# Patient Record
Sex: Female | Born: 1935 | Race: White | Hispanic: No | Marital: Single | Smoking: Never smoker
Health system: Southern US, Community
[De-identification: ages and names within clinical notes are randomized; demographics above are authoritative.]

## PROBLEM LIST (undated history)

## (undated) DIAGNOSIS — G4700 Insomnia, unspecified: Secondary | ICD-10-CM

## (undated) DIAGNOSIS — F0150 Vascular dementia without behavioral disturbance: Secondary | ICD-10-CM

## (undated) DIAGNOSIS — M810 Age-related osteoporosis without current pathological fracture: Secondary | ICD-10-CM

## (undated) DIAGNOSIS — I10 Essential (primary) hypertension: Secondary | ICD-10-CM

## (undated) DIAGNOSIS — I639 Cerebral infarction, unspecified: Secondary | ICD-10-CM

## (undated) DIAGNOSIS — H905 Unspecified sensorineural hearing loss: Secondary | ICD-10-CM

## (undated) DIAGNOSIS — I6529 Occlusion and stenosis of unspecified carotid artery: Secondary | ICD-10-CM

## (undated) DIAGNOSIS — H409 Unspecified glaucoma: Secondary | ICD-10-CM

## (undated) DIAGNOSIS — G2581 Restless legs syndrome: Secondary | ICD-10-CM

## (undated) DIAGNOSIS — H269 Unspecified cataract: Secondary | ICD-10-CM

## (undated) DIAGNOSIS — F32A Depression, unspecified: Secondary | ICD-10-CM

## (undated) DIAGNOSIS — F419 Anxiety disorder, unspecified: Secondary | ICD-10-CM

---

## 2021-09-08 ENCOUNTER — Other Ambulatory Visit: Payer: Self-pay

## 2021-09-08 ENCOUNTER — Encounter: Payer: Self-pay | Admitting: Emergency Medicine

## 2021-09-08 ENCOUNTER — Emergency Department: Payer: Medicare PPO

## 2021-09-08 ENCOUNTER — Emergency Department
Admission: EM | Admit: 2021-09-08 | Discharge: 2021-09-08 | Disposition: A | Discharge status: Home / Self Care | Payer: Medicare PPO | Attending: Emergency Medicine | Admitting: Emergency Medicine

## 2021-09-08 DIAGNOSIS — I10 Essential (primary) hypertension: Secondary | ICD-10-CM | POA: Diagnosis not present

## 2021-09-08 DIAGNOSIS — F0390 Unspecified dementia without behavioral disturbance: Secondary | ICD-10-CM | POA: Diagnosis not present

## 2021-09-08 DIAGNOSIS — M545 Low back pain, unspecified: Secondary | ICD-10-CM | POA: Insufficient documentation

## 2021-09-08 DIAGNOSIS — G8929 Other chronic pain: Secondary | ICD-10-CM | POA: Diagnosis not present

## 2021-09-08 HISTORY — DX: Depression, unspecified: F32.A

## 2021-09-08 HISTORY — DX: Unspecified glaucoma: H40.9

## 2021-09-08 HISTORY — DX: Vascular dementia, unspecified severity, without behavioral disturbance, psychotic disturbance, mood disturbance, and anxiety: F01.50

## 2021-09-08 HISTORY — DX: Cerebral infarction, unspecified: I63.9

## 2021-09-08 HISTORY — DX: Insomnia, unspecified: G47.00

## 2021-09-08 HISTORY — DX: Essential (primary) hypertension: I10

## 2021-09-08 HISTORY — DX: Anxiety disorder, unspecified: F41.9

## 2021-09-08 LAB — URINALYSIS, ROUTINE W REFLEX MICROSCOPIC
Bacteria, UA: NONE SEEN
Bilirubin Urine: NEGATIVE
Glucose, UA: NEGATIVE mg/dL
Hgb urine dipstick: NEGATIVE
Ketones, ur: NEGATIVE mg/dL
Leukocytes,Ua: NEGATIVE
Nitrite: NEGATIVE
Protein, ur: 30 mg/dL — AB
Specific Gravity, Urine: 1.019 (ref 1.005–1.030)
pH: 6 (ref 5.0–8.0)

## 2021-09-08 LAB — CBC WITH DIFFERENTIAL/PLATELET
Abs Immature Granulocytes: 0.01 10*3/uL (ref 0.00–0.07)
Basophils Absolute: 0 10*3/uL (ref 0.0–0.1)
Basophils Relative: 1 %
Eosinophils Absolute: 0.1 10*3/uL (ref 0.0–0.5)
Eosinophils Relative: 1 %
HCT: 41.5 % (ref 36.0–46.0)
Hemoglobin: 13.3 g/dL (ref 12.0–15.0)
Immature Granulocytes: 0 %
Lymphocytes Relative: 37 %
Lymphs Abs: 2.8 10*3/uL (ref 0.7–4.0)
MCH: 28.2 pg (ref 26.0–34.0)
MCHC: 32 g/dL (ref 30.0–36.0)
MCV: 87.9 fL (ref 80.0–100.0)
Monocytes Absolute: 0.7 10*3/uL (ref 0.1–1.0)
Monocytes Relative: 10 %
Neutro Abs: 3.9 10*3/uL (ref 1.7–7.7)
Neutrophils Relative %: 51 %
Platelets: 323 10*3/uL (ref 150–400)
RBC: 4.72 MIL/uL (ref 3.87–5.11)
RDW: 13.3 % (ref 11.5–15.5)
WBC: 7.5 10*3/uL (ref 4.0–10.5)
nRBC: 0 % (ref 0.0–0.2)

## 2021-09-08 LAB — BASIC METABOLIC PANEL
Anion gap: 7 (ref 5–15)
BUN: 26 mg/dL — ABNORMAL HIGH (ref 8–23)
CO2: 25 mmol/L (ref 22–32)
Calcium: 9.5 mg/dL (ref 8.9–10.3)
Chloride: 109 mmol/L (ref 98–111)
Creatinine, Ser: 0.88 mg/dL (ref 0.44–1.00)
GFR, Estimated: >60 mL/min (ref >60)
Glucose, Bld: 113 mg/dL — ABNORMAL HIGH (ref 70–99)
Potassium: 4 mmol/L (ref 3.5–5.1)
Sodium: 141 mmol/L (ref 135–145)

## 2021-09-08 LAB — BRAIN NATRIURETIC PEPTIDE: B Natriuretic Peptide: 84.3 pg/mL (ref 0.0–100.0)

## 2021-09-08 MED ORDER — FAMOTIDINE 20 MG PO TABS: 20.0000 mg | ORAL_TABLET | Freq: Every day | ORAL | 5 refills | Status: DC

## 2021-09-08 MED ORDER — LIDOCAINE 5 % EX PTCH: 1.0000 | MEDICATED_PATCH | Freq: Every day | CUTANEOUS | 0 refills | Status: AC

## 2021-09-08 MED ORDER — LIDOCAINE 5 % EX PTCH
1.0000 | MEDICATED_PATCH | Freq: Once | CUTANEOUS | Status: DC
  Administered 2021-09-08: 1 via TRANSDERMAL
  Filled 2021-09-08: qty 1

## 2021-09-08 MED ORDER — ACETAMINOPHEN ER 650 MG PO TBCR: 650.0000 mg | EXTENDED_RELEASE_TABLET | Freq: Two times a day (BID) | ORAL | 0 refills | Status: DC

## 2021-09-08 MED ORDER — ACETAMINOPHEN 325 MG PO TABS
650.0000 mg | ORAL_TABLET | Freq: Once | ORAL | Status: AC
  Administered 2021-09-08: 650 mg via ORAL
  Filled 2021-09-08: qty 2

## 2021-09-08 NOTE — ED Triage Notes (Signed)
Pt via POV from [facility] [place]. Pt here for chronic back pain, states it has been getting worse. Pt has a hx of dementia. Denies any recent falls. States the pain is worse and she been having trouble walking. Denies urinary symptoms. Pt is alert. States it hurts when she gets up and moves. Pt is alert but disoriented but at her baseline.

## 2021-09-08 NOTE — ED Provider Notes (Signed)
[facility] Emergency Department Provider Note     Event Date/Time   First MD Initiated Contact with Patient 09/08/21 1524     (approximate)   History   Back Pain   HPI  Brooke Pierce is a 88 y.o. female with a history of stroke, dementia, hypertension, NSTEMI, presents to the ED [place], [place] [facility] [address]  Patient arrives via POV escorted by family.  Patient presents with complaints of acute on chronic back pain.  Patient with a history of dementia denies any recent injury, trauma, fall.  She denies any associated urinary symptoms, chest pain, or shortness of breath.  Patient reports that the pain has increased and is making it difficult for her to walk.  She reports pain when she attempts to stand and move.  Patient presents at baseline according to family.   Physical Exam   Triage Vital Signs: ED Triage Vitals [09/08/21 1431]  Enc Vitals Group     BP 140/73     Pulse Rate 67     Resp 18     Temp (!) 97.5 F (36.4 C)     Temp Source Oral     SpO2 98 %     Weight      Height      Head Circumference      Peak Flow      Pain Score      Pain Loc      Pain Edu?      Excl. in GC?     Most recent vital signs: Vitals:   09/08/21 1431 09/08/21 1854  BP: 140/73 (!) 169/89  Pulse: 67 65  Resp: 18 16  Temp: (!) 97.5 F (36.4 C)   SpO2: 98% 100%    General Awake, no distress. NAD CV:  Good peripheral perfusion. BLE edema 2+ RESP:  Normal effort. CTA ABD:  No distention.  MSK:  Nontender to palpation along the midline lumbosacral spine.  Patient transitions from sit to stand without assistance.  She ambulates with her single-point cane without difficulty.  Patient intermittently endorses pain to the left lumbar sacral region.   ED Results / Procedures / Treatments   Labs (all labs ordered are listed, but only abnormal results are displayed) Labs Reviewed  URINALYSIS, ROUTINE W REFLEX MICROSCOPIC - Abnormal; Notable for the  following components:      Result Value   Color, Urine YELLOW (*)    APPearance CLEAR (*)    Protein, ur 30 (*)    All other components within normal limits  BASIC METABOLIC PANEL - Abnormal; Notable for the following components:   Glucose, Bld 113 (*)    BUN 26 (*)    All other components within normal limits  CBC WITH DIFFERENTIAL/PLATELET  BRAIN NATRIURETIC PEPTIDE     EKG   RADIOLOGY  I personally viewed and evaluated these images as part of my medical decision making, as well as reviewing the written report by the radiologist.  ED Provider Interpretation: no acute findings}  DG Lumbar Spine Complete  Result Date: 09/08/2021 CLINICAL DATA:  Pt here for chronic back pain, states it has been getting worse. Pt has a hx of dementia. Denies any recent falls. States the pain is worse and she been having trouble walking. Denies urinary symptoms. EXAM: LUMBAR SPINE - COMPLETE 4+ VIEW COMPARISON:  None Available. FINDINGS: No fracture.  No bone lesion. Grade 1 retrolisthesis of L1 and L2.  No other spondylolisthesis. Moderate  loss of disc height at L1-L2 and L2-L3. Mild loss of disc height at L5-S1. Facet joint narrowing on the left at L4-L5 and L5-S1 and on the right at L3-L4, L4-L5 and L5-S1. Scattered aortic atherosclerotic calcifications. Soft tissues otherwise unremarkable. Three screws proximal left femur, incompletely imaged, from a prior left proximal femur fracture ORIF. IMPRESSION: 1. No fracture or acute finding. 2. Disc and facet degenerative changes as detailed. Electronically Signed   By: Amie Portland M.D.   On: 09/08/2021 15:52     PROCEDURES:  Critical Care performed: No  Procedures   MEDICATIONS ORDERED IN ED: Medications  lidocaine (LIDODERM) 5 % 1 patch (1 patch Transdermal Patch Applied 09/08/21 1809)  acetaminophen (TYLENOL) tablet 650 mg (650 mg Oral Given 09/08/21 1805)     IMPRESSION / MDM / ASSESSMENT AND PLAN / ED COURSE  I reviewed the triage vital  signs and the nursing notes.                              Differential diagnosis includes, but is not limited to, acute on chronic pain, DDD, compression fracture, UTI, sepsis, electrolyte abnormality  Patient's presentation is most consistent with acute complicated illness / injury requiring diagnostic workup.  Geriatric patient to the ED with intermittent complaints of acute on chronic low back pain.  Patient's healthcare power of attorney versus some concerns for lower extremity edema as well as possibility of UTI.  She apparently is on Macrobid daily for cystitis.  Patient's diagnosis is consistent with acute on chronic low back pain.  Patient with overall reassuring exam and normal labs and no evidence of UTI or acute lumbar fracture is stable for discharge.  Patient will be discharged home with prescriptions for extra strength Tylenol 3 times daily, midodrine, Lidoderm patches. Patient is to follow up with her primary provider as needed or otherwise directed. Patient is given ED precautions to return to the ED for any worsening or new symptoms.   FINAL CLINICAL IMPRESSION(S) / ED DIAGNOSES   Final diagnoses:  Chronic left-sided low back pain without sciatica     Rx / DC Orders   ED Discharge Orders          Ordered    famotidine (PEPCID) 20 MG tablet  Daily        09/08/21 1843    lidocaine (LIDODERM) 5 %  Daily        09/08/21 1843    acetaminophen (TYLENOL 8 HOUR) 650 MG CR tablet  2 times daily        09/08/21 1843             Note:  This document was prepared using Dragon voice recognition software and may include unintentional dictation errors.    Lissa Hoard, PA-C 09/09/21 0001    Sharman Cheek, MD 09/09/21 1936

## 2021-09-08 NOTE — Discharge Instructions (Addendum)
Your labs, exam, and x-rays are normal and reassuring at this time.  You should follow-up with your primary provider for ongoing symptoms.  Return to the ED if needed.

## 2021-09-13 ENCOUNTER — Emergency Department: Payer: Medicare PPO

## 2021-09-13 ENCOUNTER — Other Ambulatory Visit: Payer: Self-pay

## 2021-09-13 ENCOUNTER — Observation Stay
Admission: EM | Admit: 2021-09-13 | Discharge: 2021-09-14 | Disposition: A | Discharge status: Home / Self Care | Payer: Medicare PPO | Attending: Internal Medicine | Admitting: Internal Medicine

## 2021-09-13 ENCOUNTER — Other Ambulatory Visit: Payer: Self-pay | Admitting: Internal Medicine

## 2021-09-13 DIAGNOSIS — I493 Ventricular premature depolarization: Secondary | ICD-10-CM | POA: Insufficient documentation

## 2021-09-13 DIAGNOSIS — G311 Senile degeneration of brain, not elsewhere classified: Secondary | ICD-10-CM | POA: Insufficient documentation

## 2021-09-13 DIAGNOSIS — M545 Low back pain, unspecified: Secondary | ICD-10-CM | POA: Insufficient documentation

## 2021-09-13 DIAGNOSIS — F01C4 Vascular dementia, severe, with anxiety: Secondary | ICD-10-CM | POA: Diagnosis not present

## 2021-09-13 DIAGNOSIS — R471 Dysarthria and anarthria: Secondary | ICD-10-CM | POA: Diagnosis not present

## 2021-09-13 DIAGNOSIS — R2681 Unsteadiness on feet: Secondary | ICD-10-CM | POA: Insufficient documentation

## 2021-09-13 DIAGNOSIS — R1310 Dysphagia, unspecified: Secondary | ICD-10-CM | POA: Insufficient documentation

## 2021-09-13 DIAGNOSIS — I6622 Occlusion and stenosis of left posterior cerebral artery: Secondary | ICD-10-CM | POA: Diagnosis not present

## 2021-09-13 DIAGNOSIS — F01C18 Vascular dementia, severe, with other behavioral disturbance: Secondary | ICD-10-CM | POA: Diagnosis not present

## 2021-09-13 DIAGNOSIS — K219 Gastro-esophageal reflux disease without esophagitis: Secondary | ICD-10-CM | POA: Insufficient documentation

## 2021-09-13 DIAGNOSIS — R4781 Slurred speech: Secondary | ICD-10-CM | POA: Diagnosis present

## 2021-09-13 DIAGNOSIS — R4182 Altered mental status, unspecified: Secondary | ICD-10-CM | POA: Diagnosis not present

## 2021-09-13 DIAGNOSIS — Z792 Long term (current) use of antibiotics: Secondary | ICD-10-CM | POA: Insufficient documentation

## 2021-09-13 DIAGNOSIS — F05 Delirium due to known physiological condition: Secondary | ICD-10-CM | POA: Insufficient documentation

## 2021-09-13 DIAGNOSIS — R7989 Other specified abnormal findings of blood chemistry: Secondary | ICD-10-CM | POA: Diagnosis not present

## 2021-09-13 DIAGNOSIS — F01C3 Vascular dementia, severe, with mood disturbance: Secondary | ICD-10-CM | POA: Diagnosis not present

## 2021-09-13 DIAGNOSIS — W1840XA Slipping, tripping and stumbling without falling, unspecified, initial encounter: Secondary | ICD-10-CM | POA: Diagnosis not present

## 2021-09-13 DIAGNOSIS — I6389 Other cerebral infarction: Secondary | ICD-10-CM | POA: Diagnosis not present

## 2021-09-13 DIAGNOSIS — Y92099 Unspecified place in other non-institutional residence as the place of occurrence of the external cause: Secondary | ICD-10-CM | POA: Diagnosis not present

## 2021-09-13 DIAGNOSIS — Z820 Family history of epilepsy and other diseases of the nervous system: Secondary | ICD-10-CM | POA: Insufficient documentation

## 2021-09-13 DIAGNOSIS — Z7902 Long term (current) use of antithrombotics/antiplatelets: Secondary | ICD-10-CM | POA: Diagnosis not present

## 2021-09-13 DIAGNOSIS — I6782 Cerebral ischemia: Secondary | ICD-10-CM | POA: Diagnosis not present

## 2021-09-13 DIAGNOSIS — N390 Urinary tract infection, site not specified: Secondary | ICD-10-CM | POA: Diagnosis not present

## 2021-09-13 DIAGNOSIS — Z79899 Other long term (current) drug therapy: Secondary | ICD-10-CM | POA: Insufficient documentation

## 2021-09-13 DIAGNOSIS — H9193 Unspecified hearing loss, bilateral: Secondary | ICD-10-CM | POA: Insufficient documentation

## 2021-09-13 DIAGNOSIS — G8929 Other chronic pain: Secondary | ICD-10-CM | POA: Diagnosis not present

## 2021-09-13 DIAGNOSIS — Z7982 Long term (current) use of aspirin: Secondary | ICD-10-CM | POA: Insufficient documentation

## 2021-09-13 DIAGNOSIS — E785 Hyperlipidemia, unspecified: Secondary | ICD-10-CM | POA: Insufficient documentation

## 2021-09-13 DIAGNOSIS — F0390 Unspecified dementia without behavioral disturbance: Secondary | ICD-10-CM | POA: Diagnosis present

## 2021-09-13 DIAGNOSIS — I10 Essential (primary) hypertension: Secondary | ICD-10-CM | POA: Diagnosis present

## 2021-09-13 DIAGNOSIS — I6612 Occlusion and stenosis of left anterior cerebral artery: Secondary | ICD-10-CM | POA: Insufficient documentation

## 2021-09-13 DIAGNOSIS — Z8673 Personal history of transient ischemic attack (TIA), and cerebral infarction without residual deficits: Secondary | ICD-10-CM | POA: Insufficient documentation

## 2021-09-13 DIAGNOSIS — Z87440 Personal history of urinary (tract) infections: Secondary | ICD-10-CM | POA: Insufficient documentation

## 2021-09-13 DIAGNOSIS — I639 Cerebral infarction, unspecified: Principal | ICD-10-CM | POA: Insufficient documentation

## 2021-09-13 DIAGNOSIS — I119 Hypertensive heart disease without heart failure: Secondary | ICD-10-CM | POA: Insufficient documentation

## 2021-09-13 DIAGNOSIS — J984 Other disorders of lung: Secondary | ICD-10-CM | POA: Insufficient documentation

## 2021-09-13 DIAGNOSIS — Z9181 History of falling: Secondary | ICD-10-CM | POA: Diagnosis not present

## 2021-09-13 DIAGNOSIS — I440 Atrioventricular block, first degree: Secondary | ICD-10-CM | POA: Insufficient documentation

## 2021-09-13 DIAGNOSIS — R9431 Abnormal electrocardiogram [ECG] [EKG]: Secondary | ICD-10-CM | POA: Insufficient documentation

## 2021-09-13 DIAGNOSIS — Y9301 Activity, walking, marching and hiking: Secondary | ICD-10-CM | POA: Insufficient documentation

## 2021-09-13 DIAGNOSIS — R4587 Impulsiveness: Secondary | ICD-10-CM | POA: Insufficient documentation

## 2021-09-13 LAB — ETHANOL: Alcohol, Ethyl (B): <10 mg/dL (ref <10)

## 2021-09-13 LAB — DIFFERENTIAL
Abs Immature Granulocytes: 0.01 10*3/uL (ref 0.00–0.07)
Basophils Absolute: 0 10*3/uL (ref 0.0–0.1)
Basophils Relative: 1 %
Eosinophils Absolute: 0.1 10*3/uL (ref 0.0–0.5)
Eosinophils Relative: 2 %
Immature Granulocytes: 0 %
Lymphocytes Relative: 41 %
Lymphs Abs: 2.5 10*3/uL (ref 0.7–4.0)
Monocytes Absolute: 0.7 10*3/uL (ref 0.1–1.0)
Monocytes Relative: 11 %
Neutro Abs: 2.7 10*3/uL (ref 1.7–7.7)
Neutrophils Relative %: 45 %

## 2021-09-13 LAB — COMPREHENSIVE METABOLIC PANEL
ALT: 30 U/L (ref 0–44)
AST: 21 U/L (ref 15–41)
Albumin: 3.9 g/dL (ref 3.5–5.0)
Alkaline Phosphatase: 50 U/L (ref 38–126)
Anion gap: 8 (ref 5–15)
BUN: 29 mg/dL — ABNORMAL HIGH (ref 8–23)
CO2: 23 mmol/L (ref 22–32)
Calcium: 9.5 mg/dL (ref 8.9–10.3)
Chloride: 109 mmol/L (ref 98–111)
Creatinine, Ser: 0.94 mg/dL (ref 0.44–1.00)
GFR, Estimated: 58 mL/min — ABNORMAL LOW (ref >60)
Glucose, Bld: 143 mg/dL — ABNORMAL HIGH (ref 70–99)
Potassium: 3.8 mmol/L (ref 3.5–5.1)
Sodium: 140 mmol/L (ref 135–145)
Total Bilirubin: 0.6 mg/dL (ref 0.3–1.2)
Total Protein: 6.8 g/dL (ref 6.5–8.1)

## 2021-09-13 LAB — PROTIME-INR
INR: 1 (ref 0.8–1.2)
Prothrombin Time: 12.7 seconds (ref 11.4–15.2)

## 2021-09-13 LAB — APTT: aPTT: 28 seconds (ref 24–36)

## 2021-09-13 LAB — CBC
HCT: 39.5 % (ref 36.0–46.0)
Hemoglobin: 12.5 g/dL (ref 12.0–15.0)
MCH: 28.3 pg (ref 26.0–34.0)
MCHC: 31.6 g/dL (ref 30.0–36.0)
MCV: 89.6 fL (ref 80.0–100.0)
Platelets: 323 10*3/uL (ref 150–400)
RBC: 4.41 MIL/uL (ref 3.87–5.11)
RDW: 13.5 % (ref 11.5–15.5)
WBC: 6 10*3/uL (ref 4.0–10.5)
nRBC: 0 % (ref 0.0–0.2)

## 2021-09-13 MED ORDER — LORAZEPAM 1 MG PO TABS
1.0000 mg | ORAL_TABLET | Freq: Once | ORAL | Status: AC
  Administered 2021-09-13: 1 mg via ORAL
  Filled 2021-09-13: qty 1

## 2021-09-13 MED ORDER — ASPIRIN 81 MG PO CHEW
324.0000 mg | CHEWABLE_TABLET | Freq: Once | ORAL | Status: AC
  Administered 2021-09-14: 324 mg via ORAL
  Filled 2021-09-13: qty 4

## 2021-09-13 MED ORDER — GADOBUTROL 1 MMOL/ML IV SOLN: 7.0000 mL | Freq: Once | INTRAVENOUS | Status: DC | PRN

## 2021-09-13 MED ORDER — SODIUM CHLORIDE 0.9% FLUSH
3.0000 mL | Freq: Once | INTRAVENOUS | Status: AC
  Administered 2021-09-14: 3 mL via INTRAVENOUS

## 2021-09-13 NOTE — ED Triage Notes (Signed)
Patient to ER via Pov with family member from [facility] [address] Patient was seen on Saturday and evaluated for her chronic back pain. Facility placed patient on an antibiotic, family believes for a UTI. On Sunday, patient started to become progressively more confused with slurred speech. Patient has also been exhibiting bizarre behaviors including placing her walker into the front of the car and trying to access other cars in the parking lot.   Hx of dementia, alert at baseline. Hx of stroke in 2011.

## 2021-09-13 NOTE — ED Provider Notes (Signed)
[facility] Provider Note    Event Date/Time   First MD Initiated Contact with Patient 09/13/21 1710     (approximate)   History   No chief complaint on file.   HPI  Brooke Pierce is a 88 y.o. female with history of dementia, stroke, hypertension presents emergency department with her family member.  She resides at the [facility].  Patient was seen here on Saturday and evaluated for chronic back pain.  The [facility] facility had placed her on an antibiotic, Macrobid for UTI.  Patient began to act on it on Sunday when she was more confused and had slurred speech.  She has been having bizarre behavior where she has been trying to walk in front of cars and trying to access cars in the parking lot.  Patient's stroke was in 2011.  She is ambulatory without help.      Physical Exam   Triage Vital Signs: ED Triage Vitals [09/13/21 1608]  Enc Vitals Group     BP (!) 151/109     Pulse Rate 75     Resp 17     Temp 97.8 F (36.6 C)     Temp Source Oral     SpO2 98 %     Weight      Height      Head Circumference      Peak Flow      Pain Score      Pain Loc      Pain Edu?      Excl. in GC?     Most recent vital signs: Vitals:   09/13/21 1730 09/13/21 1800  BP: (!) 176/58 (!) 146/109  Pulse: 67 68  Resp: 17 17  Temp:    SpO2: 96% 99%     General: Awake, no distress.   CV:  Good peripheral perfusion. regular rate and  rhythm Resp:  Normal effort. Lungs  cta Abd:  No distention.  Nontender Other:  Spine is minimally tender, patient is ambulatory with mild assistance, is able to respond to my questions, 5/5 strength in the lower and upper extremities   ED Results / Procedures / Treatments   Labs (all labs ordered are listed, but only abnormal results are displayed) Labs Reviewed  COMPREHENSIVE METABOLIC PANEL - Abnormal; Notable for the following components:      Result Value   Glucose, Bld 143 (*)    BUN 29 (*)    GFR, Estimated 58  (*)    All other components within normal limits  PROTIME-INR  APTT  CBC  DIFFERENTIAL  ETHANOL  URINALYSIS, ROUTINE W REFLEX MICROSCOPIC  CBG MONITORING, ED     EKG  EKG   RADIOLOGY CT of the head    PROCEDURES:   Procedures   MEDICATIONS ORDERED IN ED: Medications  sodium chloride flush (NS) 0.9 % injection 3 mL (has no administration in time range)     IMPRESSION / MDM / ASSESSMENT AND PLAN / ED COURSE  I reviewed the triage vital signs and the nursing notes.                              Differential diagnosis includes, but is not limited to, CVA, sepsis, UTI, worsening dementia, medication reaction  Patient's presentation is most consistent with acute presentation with potential threat to life or bodily function.   CT of the head interpreted by me as being  negative for new stroke.  Radiologist comments that she does have chronic microvascular disease.  Labs are reassuring, CBC, metabolic panel, PT PTT, EtOH are all normal  UA added  Chest x-ray one-view portable interpreted by me as being normal, however radiologist is reading the area saying may be a patchy infiltrate indicating infection and would like a full AP and lateral chest x-ray.  Care transferred to Dr. Augusto Gamble  FINAL CLINICAL IMPRESSION(S) / ED DIAGNOSES   Final diagnoses:  Altered mental status, unspecified altered mental status type     Rx / DC Orders   ED Discharge Orders     None        Note:  This document was prepared using Dragon voice recognition software and may include unintentional dictation errors.    Faythe Ghee, PA-C 09/13/21 1858    Georga Hacking, MD 09/13/21 [phone]

## 2021-09-14 ENCOUNTER — Observation Stay
Admit: 2021-09-14 | Discharge: 2021-09-14 | Disposition: A | Discharge status: Home / Self Care | Payer: Medicare PPO | Attending: Internal Medicine | Admitting: Internal Medicine

## 2021-09-14 ENCOUNTER — Encounter: Payer: Self-pay | Admitting: Internal Medicine

## 2021-09-14 ENCOUNTER — Observation Stay: Payer: Medicare PPO

## 2021-09-14 DIAGNOSIS — F02B0 Dementia in other diseases classified elsewhere, moderate, without behavioral disturbance, psychotic disturbance, mood disturbance, and anxiety: Secondary | ICD-10-CM

## 2021-09-14 DIAGNOSIS — I10 Essential (primary) hypertension: Secondary | ICD-10-CM | POA: Diagnosis present

## 2021-09-14 DIAGNOSIS — I639 Cerebral infarction, unspecified: Secondary | ICD-10-CM | POA: Diagnosis not present

## 2021-09-14 DIAGNOSIS — G301 Alzheimer's disease with late onset: Secondary | ICD-10-CM

## 2021-09-14 DIAGNOSIS — F0390 Unspecified dementia without behavioral disturbance: Secondary | ICD-10-CM | POA: Diagnosis present

## 2021-09-14 LAB — CBC
HCT: 37.2 % (ref 36.0–46.0)
Hemoglobin: 11.9 g/dL — ABNORMAL LOW (ref 12.0–15.0)
MCH: 28.3 pg (ref 26.0–34.0)
MCHC: 32 g/dL (ref 30.0–36.0)
MCV: 88.4 fL (ref 80.0–100.0)
Platelets: 288 10*3/uL (ref 150–400)
RBC: 4.21 MIL/uL (ref 3.87–5.11)
RDW: 13.4 % (ref 11.5–15.5)
WBC: 6.6 10*3/uL (ref 4.0–10.5)
nRBC: 0 % (ref 0.0–0.2)

## 2021-09-14 LAB — LIPID PANEL
Cholesterol: 203 mg/dL — ABNORMAL HIGH (ref 0–200)
HDL: 46 mg/dL (ref >40)
LDL Cholesterol: 111 mg/dL — ABNORMAL HIGH (ref 0–99)
Total CHOL/HDL Ratio: 4.4 RATIO
Triglycerides: 230 mg/dL — ABNORMAL HIGH (ref <150)
VLDL: 46 mg/dL — ABNORMAL HIGH (ref 0–40)

## 2021-09-14 LAB — ECHOCARDIOGRAM COMPLETE
AR max vel: 2.37 cm2
AV Area VTI: 2.03 cm2
AV Area mean vel: 2.16 cm2
AV Mean grad: 3 mmHg
AV Peak grad: 5.7 mmHg
Ao pk vel: 1.19 m/s
Area-P 1/2: 5.13 cm2
Height: 65 in
S' Lateral: 2.73 cm

## 2021-09-14 LAB — URINALYSIS, ROUTINE W REFLEX MICROSCOPIC
Bilirubin Urine: NEGATIVE
Glucose, UA: NEGATIVE mg/dL
Hgb urine dipstick: NEGATIVE
Ketones, ur: NEGATIVE mg/dL
Leukocytes,Ua: NEGATIVE
Nitrite: NEGATIVE
Protein, ur: NEGATIVE mg/dL
Specific Gravity, Urine: 1.036 — ABNORMAL HIGH (ref 1.005–1.030)
pH: 6 (ref 5.0–8.0)

## 2021-09-14 LAB — HEMOGLOBIN A1C
Hgb A1c MFr Bld: 5.4 % (ref 4.8–5.6)
Mean Plasma Glucose: 108.28 mg/dL

## 2021-09-14 LAB — CREATININE, SERUM
Creatinine, Ser: 0.8 mg/dL (ref 0.44–1.00)
GFR, Estimated: >60 mL/min (ref >60)

## 2021-09-14 MED ORDER — NITROFURANTOIN MACROCRYSTAL 50 MG PO CAPS
50.0000 mg | ORAL_CAPSULE | Freq: Every day | ORAL | Status: DC
  Administered 2021-09-14: 50 mg via ORAL
  Filled 2021-09-14: qty 1

## 2021-09-14 MED ORDER — ATORVASTATIN CALCIUM 40 MG PO TABS: 40.0000 mg | ORAL_TABLET | Freq: Every day | ORAL | 0 refills | Status: DC

## 2021-09-14 MED ORDER — LOSARTAN POTASSIUM 50 MG PO TABS
100.0000 mg | ORAL_TABLET | Freq: Every day | ORAL | Status: DC
  Administered 2021-09-14: 100 mg via ORAL
  Filled 2021-09-14: qty 2

## 2021-09-14 MED ORDER — ASPIRIN 325 MG PO TABS
325.0000 mg | ORAL_TABLET | Freq: Every day | ORAL | Status: DC
  Administered 2021-09-14: 325 mg via ORAL
  Filled 2021-09-14: qty 1

## 2021-09-14 MED ORDER — STROKE: EARLY STAGES OF RECOVERY BOOK: Freq: Once | Status: DC

## 2021-09-14 MED ORDER — DULOXETINE HCL 60 MG PO CPEP
60.0000 mg | ORAL_CAPSULE | Freq: Every day | ORAL | Status: DC
  Administered 2021-09-14: 60 mg via ORAL
  Filled 2021-09-14: qty 1

## 2021-09-14 MED ORDER — ASPIRIN 81 MG PO TBEC: 81.0000 mg | DELAYED_RELEASE_TABLET | Freq: Every day | ORAL | 0 refills | Status: AC

## 2021-09-14 MED ORDER — DULOXETINE HCL 30 MG PO CPEP
30.0000 mg | ORAL_CAPSULE | Freq: Every day | ORAL | Status: DC
  Filled 2021-09-14: qty 1

## 2021-09-14 MED ORDER — ATORVASTATIN CALCIUM 20 MG PO TABS: 40.0000 mg | ORAL_TABLET | Freq: Every day | ORAL | Status: DC

## 2021-09-14 MED ORDER — POLYVINYL ALCOHOL 1.4 % OP SOLN
1.0000 [drp] | Freq: Four times a day (QID) | OPHTHALMIC | Status: DC
  Filled 2021-09-14: qty 15

## 2021-09-14 MED ORDER — SODIUM CHLORIDE 0.9 % IV SOLN: INTRAVENOUS | Status: DC

## 2021-09-14 MED ORDER — ENOXAPARIN SODIUM 40 MG/0.4ML IJ SOSY
40.0000 mg | PREFILLED_SYRINGE | INTRAMUSCULAR | Status: DC
  Filled 2021-09-14: qty 0.4

## 2021-09-14 MED ORDER — ACETAMINOPHEN 160 MG/5ML PO SOLN: 650.0000 mg | ORAL | Status: DC | PRN

## 2021-09-14 MED ORDER — CLOPIDOGREL BISULFATE 75 MG PO TABS
75.0000 mg | ORAL_TABLET | Freq: Every day | ORAL | Status: DC
  Administered 2021-09-14: 75 mg via ORAL
  Filled 2021-09-14: qty 1

## 2021-09-14 MED ORDER — ASPIRIN 81 MG PO TBEC: 81.0000 mg | DELAYED_RELEASE_TABLET | Freq: Every day | ORAL | Status: DC

## 2021-09-14 MED ORDER — LATANOPROST 0.005 % OP SOLN
1.0000 [drp] | Freq: Every day | OPHTHALMIC | Status: DC
Start: 2021-09-14 — End: 2021-09-14

## 2021-09-14 MED ORDER — IOHEXOL 350 MG/ML SOLN
75.0000 mL | Freq: Once | INTRAVENOUS | Status: AC | PRN
  Administered 2021-09-14: 75 mL via INTRAVENOUS

## 2021-09-14 MED ORDER — ACETAMINOPHEN 325 MG RE SUPP: 650.0000 mg | RECTAL | Status: DC | PRN

## 2021-09-14 MED ORDER — AMLODIPINE BESYLATE 5 MG PO TABS
10.0000 mg | ORAL_TABLET | Freq: Every day | ORAL | Status: DC
  Administered 2021-09-14: 10 mg via ORAL
  Filled 2021-09-14: qty 2

## 2021-09-14 MED ORDER — FAMOTIDINE 20 MG PO TABS
20.0000 mg | ORAL_TABLET | Freq: Every day | ORAL | Status: DC
  Administered 2021-09-14: 20 mg via ORAL
  Filled 2021-09-14: qty 1

## 2021-09-14 MED ORDER — TIMOLOL MALEATE 0.25 % OP SOLN
1.0000 [drp] | Freq: Every day | OPHTHALMIC | Status: DC
Start: 2021-09-14 — End: 2021-09-14

## 2021-09-14 MED ORDER — LIDOCAINE 5 % EX PTCH
1.0000 | MEDICATED_PATCH | Freq: Every day | CUTANEOUS | Status: DC
  Filled 2021-09-14: qty 1

## 2021-09-14 MED ORDER — LATANOPROST 0.005 % OP SOLN
1.0000 [drp] | Freq: Every day | OPHTHALMIC | Status: DC
Start: 2021-09-15 — End: 2021-09-14

## 2021-09-14 MED ORDER — TIMOLOL MALEATE 0.25 % OP SOLN
1.0000 [drp] | Freq: Every day | OPHTHALMIC | Status: DC
Start: 2021-09-15 — End: 2021-09-14

## 2021-09-14 MED ORDER — ACETAMINOPHEN 325 MG PO TABS: 650.0000 mg | ORAL_TABLET | ORAL | Status: DC | PRN

## 2021-09-14 MED ORDER — QUETIAPINE FUMARATE 25 MG PO TABS: 25.0000 mg | ORAL_TABLET | Freq: Every day | ORAL | Status: DC

## 2021-09-14 MED ORDER — ASPIRIN 300 MG RE SUPP
300.0000 mg | Freq: Every day | RECTAL | Status: DC
  Filled 2021-09-14: qty 1

## 2021-09-14 NOTE — ED Notes (Addendum)
This RN contacted Dr. Chipper Herb to request speech consult as previous shift EMT states pt. Had choking episode. Pt and daughter (POA) updated on POC. Pt's daughter requested to speak with pharmacy tech about medications. This RN communicated with Pharm tech, she will drop back in on them.

## 2021-09-14 NOTE — Progress Notes (Signed)
*  PRELIMINARY RESULTS* Echocardiogram 2D Echocardiogram has been performed.  Brooke Pierce 09/14/2021, 3:20 PM

## 2021-09-14 NOTE — ED Notes (Signed)
This RN to bedside for assessment. Pt. Sitting up on stretcher with family at bedside.additional warm blanket provided per request. Pt. Denies further needs or complaints at this time.

## 2021-09-14 NOTE — Progress Notes (Signed)
Admission profile updated. ?

## 2021-09-14 NOTE — ED Notes (Signed)
Prior to discharge, pt. Cleaned, brief and pad changed, clothed, and IV removed. Pt. Discharged into care of daughter who will transport back to [facility].

## 2021-09-14 NOTE — Consult Note (Signed)
Neurology Consultation Reason for Consult: Stroke Requesting Physician: Dr. Marrion Coy  CC: Slurred speech  History is obtained from: Chart review and daughter at bedside  HPI: Brooke Pierce is a 88 y.o. female who presents with intermittent slurred speech with a past medical history significant for hypertension, prior stroke, vascular dementia, bilateral hearing loss, and chart report of other medical conditions denied by family including diabetes, coronary artery disease and right carotid stenosis.  Daughter reports that the patient was last her normal self on Sunday when she was walking in her assisted living facility and suddenly stumbled, subsequently seemed to have intact strength but slurred speech which came and went over the next few days.  Patient refused transport to the hospital on Sunday, but given persistent speech difficulty presented 6/15 for further evaluation.  MRI brain revealed left corona radiata stroke and neurology was consulted  At baseline the patient requires assistance with IADLs and has some sundowning, but walks independently  LKW: 6/11 tPA given?: No, out of the window IA performed?: No, exam not consistent with LVO Premorbid modified rankin scale:      3 - Moderate disability. Requires some help, but able to walk unassisted.  ROS: Unable to obtain due to baseline dementia, but no other complaints per family  Past Medical History:  Diagnosis Date   Anxiety    Depression    Glaucoma    HTN (hypertension)    Insomnia    Stroke [facility])    Vascular dementia (HCC)    History reviewed. No pertinent surgical history.  Current Outpatient Medications  Medication Instructions   acetaminophen (TYLENOL 8 HOUR) 650 mg, Oral, 2 times daily   amLODipine (NORVASC) 10 mg, Oral, Daily   cefdinir (OMNICEF) 300 mg, Oral, 2 times daily   clopidogrel (PLAVIX) 75 mg, Oral, Daily   D3 50 MCG (2000 UT) TABS 1 tablet, Oral, Daily   DULoxetine (CYMBALTA) 30 mg, Oral,  Daily, Take along with one 60 mg capsule for total 90 mg once daily   DULoxetine (CYMBALTA) 60 mg, Oral, Daily, Take along with one 30 mg capsule for total 90 mg once daily   famotidine (PEPCID) 20 mg, Oral, Daily   latanoprost (XALATAN) 0.005 % ophthalmic solution 1 drop, Both Eyes, Daily at bedtime   lidocaine (LIDODERM) 5 % 1 patch, Transdermal, Daily, Remove & Discard patch after 12 hours of wear each day.   losartan (COZAAR) 100 mg, Oral, Daily   nitrofurantoin (MACRODANTIN) 50 mg, Oral, Daily   PROLIA 60 MG/ML SOSY injection Subcutaneous   QUEtiapine (SEROQUEL) 25 mg, Oral, Daily at bedtime   REFRESH TEARS 0.5 % SOLN 1 drop, Both Eyes, 4 times daily   timolol (TIMOPTIC) 0.25 % ophthalmic solution 1 drop, Both Eyes, Daily     Family History  Problem Relation Age of Onset   Dementia Brother     Social History:  reports that she has never smoked. She has never used smokeless tobacco. No history on file for alcohol use and drug use.  Exam: Current vital signs: BP (!) 148/70   Pulse 71   Temp 97.8 F (36.6 C) (Oral)   Resp 20   Ht 5\' 5"  (1.651 m)   SpO2 98%  Vital signs in last 24 hours: Temp:  [97.8 F (36.6 C)] 97.8 F (36.6 C) (06/15 1608) Pulse Rate:  [66-75] 71 (06/16 0530) Resp:  [17-20] 20 (06/16 0530) BP: (127-176)/(58-109) 148/70 (06/16 0530) SpO2:  [95 %-99 %] 98 % (06/16 0530)  Physical Exam  Constitutional: Appears well-developed and well-nourished, feeding herself lunch Psych: Affect appropriate to situation, cooperative and jocular, irritable with family when memory impairment was mentioned Eyes: No scleral injection HENT: No oropharyngeal obstruction.  MSK: no joint deformities.  Cardiovascular: Perfusing extremities well Respiratory: Effort normal, non-labored breathing GI: Soft.  No distension. There is no tenderness.  Skin: Warm dry and intact visible skin, she does have some peripheral edema in the bilateral lower extremities which is  stable/baseline  Neuro: Mental Status: Patient is awake, alert, oriented to person, age, but not month Patient gives limited history Very mild aphasia (cannot repeat "no ifs, ands or but"s correctly, but otherwise able to name and repeat simple sentences).  No neglect Cranial Nerves: II: Visual Fields are full. Pupils are equal, round, and reactive to light.   III,IV, VI: EOMI without ptosis or diploplia.  Saccadic pursuits V: Facial sensation is symmetric to light touch VII: Facial movement is symmetric.  VIII: hearing is baseline very hard of hearing X: Uvula elevates symmetrically XI: Shoulder shrug is symmetric. XII: tongue is midline without atrophy or fasciculations.  Motor: Tone is normal. Bulk is normal. 5/5 strength was present in all four extremities, knee flexion and extension testing limited secondary to pain due to edema Sensory: Sensation is symmetric to light touch in all 4 extremities Cerebellar: FNF and HKS are intact bilaterally Gait:  Deferred by patient as she was eating lunch  NIHSS total 2 (but 0 for new deficits) Score breakdown: One-point for not knowing the month (baseline), one-point for not being able to repeat complex sentences Performed at 2 PM on 6/16   I have reviewed labs in epic and the results pertinent to this consultation are:   Basic Metabolic Panel: Recent Labs  Lab 09/08/21 1716 09/13/21 1610 09/14/21 0320  NA 141 140  --   K 4.0 3.8  --   CL 109 109  --   CO2 25 23  --   GLUCOSE 113* 143*  --   BUN 26* 29*  --   CREATININE 0.88 0.94 0.80  CALCIUM 9.5 9.5  --     CBC: Recent Labs  Lab 09/08/21 1716 09/13/21 1610 09/14/21 0320  WBC 7.5 6.0 6.6  NEUTROABS 3.9 2.7  --   HGB 13.3 12.5 11.9*  HCT 41.5 39.5 37.2  MCV 87.9 89.6 88.4  PLT 323 323 288    Coagulation Studies: Recent Labs    09/13/21 1610  LABPROT 12.7  INR 1.0    Lab Results  Component Value Date   CHOL 203 (H) 09/14/2021   HDL 46 09/14/2021    LDLCALC 111 (H) 09/14/2021   TRIG 230 (H) 09/14/2021   CHOLHDL 4.4 09/14/2021   Lab Results  Component Value Date   HGBA1C 5.4 09/14/2021     I have reviewed the images obtained:  MRI HEAD IMPRESSION: Personally reviewed, agree with radiology -- reviewed with family at bedside  1. 2 cm acute ischemic nonhemorrhagic left basal ganglia infarct. 2. Underlying age-related cerebral atrophy with moderately advanced chronic microvascular ischemic disease.  MRA HEAD IMPRESSION: Personally reviewed, agree with radiology:  1. Negative intracranial MRA for large vessel occlusion. 2. Moderate proximal left A2 and P2 stenoses. 3. No other hemodynamically significant or correctable stenosis identified. Please note that while an MRA neck was also ordered as a part of this exam, this was unable to be performed as the patient was unable to tolerate the full length of the study. No charges for  this should be applied to the patient.  CTA personally reviewed, agree with radiology:   1. Negative CTA of the neck. No hemodynamically significant stenosis or other acute vascular abnormality. 2. Scattered ground-glass density within the partially visualized lungs, likely mild pulmonary interstitial edema.  Impression: By appearance the patient has a small vessel stroke in the setting of small vessel risk factors of age, hypertension, hyperlipidemia (new diagnosis).  Given her age and the fact that she has not previously had a statin, will start atorvastatin at 40 mg instead of 80; room for increase if LDL does not meet goal in the future  Recommendations: - ECHO pending, in the absence of any new cardiac symptoms follow-up with primary care physician - LDL 111, initiate atorvastatin 40 mg nightly - Small vessel stroke without critical artery stenosis, initiate aspirin for dual antiplatelet therapy for 3 weeks - Continue Plavix 75 mg daily - Normotension blood pressure goal - No further inpatient  neurological work-up at this time - Discussed with Dr. Chipper Herb at bedside  Brooke Dare MD-PhD [facility] [phone] [facility] coverage for [facility] is from 8 AM to 4 AM in-house and 4 PM to 8 PM by telephone/video. 8 PM to 8 AM emergent questions or overnight urgent questions should be addressed to Teleneurology On-call or [facility] neurohospitalist; contact information can be found on AMION

## 2021-09-14 NOTE — ED Notes (Signed)
Pt cleaned of wet brief.  Dry brief placed on pt and purewick repositioned at this time.  Warm blankets applied to pt per request.  Pt denies any further needs at this time.

## 2021-09-14 NOTE — Care Management (Signed)
RNCM attempted to reach POA, but was not able.  TOC to follow for needs.

## 2021-09-14 NOTE — ED Notes (Signed)
Neurology at bedside.

## 2021-09-14 NOTE — Evaluation (Signed)
Occupational Therapy Evaluation Patient Details Name: Brooke Pierce MRN: [id number] DOB: [birth year] Today's Date: 09/14/2021   History of Present Illness Brooke Pierce is a 88 y.o. female with history of advanced dementia, hypertension prior stroke has been experiencing slurred speech since June 11. 09/13/21 Brain MRI: 2 cm acute ischemic nonhemorrhagic left basal ganglia infarct.   Clinical Impression   Brooke Pierce was seen for OT evaluation this date. Prior to hospital admission, pt was MOD I for mobility, setup for bathing and assist for meals/meds. Pt lives at [facility]. Pt presents to acute OT demonstrating impaired ADL performance and functional mobility 2/2 decreased activity tolerance and functional strength/balance deficits. Difficulty following cues for vision testing, to be further assessed.   Pt currently requires CGA + IV pole for toilet t/f and hand washing standing sink side. SUPERVISION don/doff socks in sitting, MOD A don /doff brief in standing (steps in/out of brief). Pt would benefit from skilled OT to address noted impairments and functional limitations (see below for any additional details). Upon hospital discharge, recommend HHOT to maximize pt safety and return to PLOF.    Recommendations for follow up therapy are one component of a multi-disciplinary discharge planning process, led by the attending physician.  Recommendations may be updated based on patient status, additional functional criteria and insurance authorization.   Follow Up Recommendations  Home health OT    Assistance Recommended at Discharge Intermittent Supervision/Assistance  Patient can return home with the following A little help with walking and/or transfers;A little help with bathing/dressing/bathroom    Functional Status Assessment  Patient has had a recent decline in their functional status and demonstrates the ability to make significant improvements in function in a reasonable and  predictable amount of time.  Equipment Recommendations  BSC/3in1    Recommendations for Other Services       Precautions / Restrictions Precautions Precautions: Fall Restrictions Weight Bearing Restrictions: No      Mobility Bed Mobility Overal bed mobility: Modified Independent                  Transfers Overall transfer level: Needs assistance Equipment used: 1 person hand held assist Transfers: Sit to/from Stand Sit to Stand: Min guard           General transfer comment: cues from high bed      Balance Overall balance assessment: Needs assistance Sitting-balance support: No upper extremity supported Sitting balance-Leahy Scale: Good     Standing balance support: No upper extremity supported, During functional activity Standing balance-Leahy Scale: Fair                             ADL either performed or assessed with clinical judgement   ADL Overall ADL's : Needs assistance/impaired                                       General ADL Comments: CGA + IV pole for toilet t/f and hand washing standing sink side. SUPERVISION don/doff socks in sitting, MOD A don /doff brief in standing (steps in/out of brief).     Vision   Additional Comments: difficulty tracking, unclear if vision vs dognition            Pertinent Vitals/Pain Pain Assessment Pain Assessment: No/denies pain     Hand Dominance Right   Extremity/Trunk Assessment  Upper Extremity Assessment Upper Extremity Assessment: Overall WFL for tasks assessed (increased time for [facility])   Lower Extremity Assessment Lower Extremity Assessment: Generalized weakness       Communication Communication Communication: No difficulties   Cognition Arousal/Alertness: Awake/alert Behavior During Therapy: WFL for tasks assessed/performed Overall Cognitive Status: History of cognitive impairments - at baseline                                                   Home Living Family/patient expects to be discharged to:: Assisted living                             Home Equipment: Rolling Walker (2 wheels)   Additional Comments: [facility]      Prior Functioning/Environment Prior Level of Function : Needs assist               ADLs Comments: per friend in room, pt refuses showers, assist for meals/meds        OT Problem List: Impaired balance (sitting and/or standing);Decreased activity tolerance;Decreased safety awareness      OT Treatment/Interventions: Self-care/ADL training;Therapeutic exercise;DME and/or AE instruction;Manual therapy;Therapeutic activities;Patient/family education;Balance training    OT Goals(Current goals can be found in the care plan section) Acute Rehab OT Goals Patient Stated Goal: to go home OT Goal Formulation: With patient/family Time For Goal Achievement: 09/28/21 Potential to Achieve Goals: Good ADL Goals Pt Will Perform Grooming: standing;with modified independence Pt Will Perform Lower Body Dressing: Independently;sit to/from stand Pt Will Transfer to Toilet: with modified independence;ambulating;regular height toilet  OT Frequency: Min 2X/week    Co-evaluation              AM-PAC OT "6 Clicks" Daily Activity     Outcome Measure Help from another person eating meals?: None Help from another person taking care of personal grooming?: A Little Help from another person toileting, which includes using toliet, bedpan, or urinal?: A Little Help from another person bathing (including washing, rinsing, drying)?: A Little Help from another person to put on and taking off regular upper body clothing?: None Help from another person to put on and taking off regular lower body clothing?: A Little 6 Click Score: 20   End of Session Nurse Communication: Mobility status  Activity Tolerance: Patient tolerated treatment well Patient left: in bed;with call bell/phone within  reach;with family/visitor present  OT Visit Diagnosis: Unsteadiness on feet (R26.81)                Time: 5053-9767 OT Time Calculation (min): 27 min Charges:  OT General Charges $OT Visit: 1 Visit OT Evaluation $OT Eval Moderate Complexity: 1 Mod OT Treatments $Self Care/Home Management : 8-22 mins  Kathie Dike, M.S. OTR/L  09/14/21, 12:38 PM  ascom [phone]

## 2021-09-14 NOTE — Evaluation (Signed)
Clinical/Bedside Swallow Evaluation Patient Details  Name: Brooke Pierce MRN: [id number] Date of Birth: 1931-08-09  Today's Date: 09/14/2021 Time: SLP Start Time (ACUTE ONLY): 1245 SLP Stop Time (ACUTE ONLY): 1345 SLP Time Calculation (min) (ACUTE ONLY): 60 min  Past Medical History:  Past Medical History:  Diagnosis Date   Anxiety    Depression    Glaucoma    HTN (hypertension)    Insomnia    Stroke (HCC)    Vascular dementia (HCC)    Past Surgical History: History reviewed. No pertinent surgical history. HPI:  Pt is a 88 y.o. female with history of Advanced Dementia w/ Impulsive eating behaviors, REFLUX(not on a PPI), hypertension prior stroke has been experiencing slurred speech since 09/09/21 - Daughter stated "much improved now"(09/14/21).   A day prior, patient had low back pain.  Patient also was recently placed on antibiotics for possible UTI.  Since weakness and speech changes were present, pt was brought to the ER. Per MD at admit, pt did not have any weakness of the extremities.    09/13/21 Brain MRI: 2 cm acute ischemic nonhemorrhagic left basal ganglia infarct.  A second CXR imagain revealed: "No active cardiopulmonary disease".    Assessment / Plan / Recommendation  Clinical Impression   Pt seen for BSE in ED. Daughter, friend present. Pt oriented to self, Daughter but not to place. Daughter stated pt's speech was "much improved now as the week has gone on"(as of 09/14/21). Friend present agreed stating pt was at her baseline re: her speech. Pt completed Automatic Speech Tasks w/ this Clinician and Daughter felt pt's speech was appropriate for her. Pt was easily distracted but followed through w/ instructions given Mod verbal cues -- did not seem to want to be cued re: her eating/drinking behaviors. Daughter stated this was baseline behavior including Impulsive eating/drinking behavior. This may have contributed to the coughing episode pt had (per NSG/Dtr) -- it was described  that pt had a "mouthful" of food then she attempted to drink coffee at the same time.  Daughter also endorsed pt having significant s/s of REFLUX c/b frequent Belching w/ meals, cough. Pt is Not on a PPI per chart notes; recently started on Pepcid at her facility per Dtr.   Pt appears to present w/ adequate oropharyngeal phase swallow function in setting of declined Cognitive status; Baseline advanced Dementia. ANY Cognitive decline can impact her overall awareness/timing of swallow and safety during po tasks which increases risk for aspiration, choking. Pt's risk for aspiration/choking can be reduced when following general aspiration precautions including STOPPING IMPULSIVE FEEDING BEHAVIORS, using a slightly modified diet consistency of broken-down/cut foods that are moistened while monitoring Mixed Consistency foods, and when provided Supervision during meals to monitor for IMPULSIVE Feeding behaviors and REDUCE such behaviors. She required min-mod verbal/tactile cues for follow through during po tasks and self-feeding w/ this evaluation.        Pt consumed several trials of ice chips, purees, soft solids, and thin liquids VIA CUP w/ No overt clinical s/s of aspiration noted: no decline in vocal quality; no cough, and no decline in respiratory status during/post trials. O2 sats remained in upper 90s. Oral phase was adequate for bolus management, mastication, and oral clearing of the boluses given. Mastication of softened solids was adequate and moistening the foods; cues and Time given for oral clearing b/t bites vs taking another bite too quickly (overfilling the mouth). Pt fed self but required min-mod oversight d/t the Cognitive decline. Feeding self  improves safety of swallowing. OM Exam appeared [facility] w/ No unilateral weakness noted. Slight confusion of OM tasks but functional.          In setting of baseline Dementia and Cognitive decline, and her risk for choking d/t Impulsive eating/drinking  behaviors reported, recommend a more dysphagia level 3(mech soft foods moistened for ease and cohesion) w/ thin liquids -- CUP DRINKING RECOMMENDED; general aspiration precautions; reduce Distractions during meals and engage pt during meals to follow aspiration precautions -- slow self-feeding and clear mouth b/t bites. Pills Whole in Puree as needed. Supervision at meals. MD/NSG updated.  ST services recommends follow w/ Palliative Care for GOC and education re: impact of Cognitive decline/Dementia on behaviors, swallowing. Suspect pt is close to/at her baseline. Precautions given.  SLP Visit Diagnosis: Dysphagia, unspecified (R13.10) (baseline Dementia; GERD)    Aspiration Risk   (reduced following general aspiration precautions; risk for aspiration of REFLUX material sec to GERD and impulsive eating/drinking behaviors)    Diet Recommendation   a more dysphagia level 3(mech soft foods moistened for ease and cohesion) w/ thin liquids -- CUP DRINKING RECOMMENDED; general aspiration precautions; reduce Distractions during meals and engage pt during meals to follow aspiration precautions -- slow self-feeding and clear mouth b/t bites. Supervision at meals.   Medication Administration: Whole meds with puree (for safer swallowing as needed)    Other  Recommendations Recommended Consults: Consider GI evaluation (Dietician f/u; Palliative Care f/u for GOC, education; REFLUX management/PPI as indicated) Oral Care Recommendations: Oral care BID;Oral care before and after PO;Patient independent with oral care (Staff support) Other Recommendations:  (n/a)    Recommendations for follow up therapy are one component of a multi-disciplinary discharge planning process, led by the attending physician.  Recommendations may be updated based on patient status, additional functional criteria and insurance authorization.  Follow up Recommendations Follow physician's recommendations for discharge plan and follow up  therapies      Assistance Recommended at Discharge Intermittent Supervision/Assistance  Functional Status Assessment Patient has had a recent decline in their functional status and/or demonstrates limited ability to make significant improvements in function in a reasonable and predictable amount of time  Frequency and Duration  (n/a)   (n/a)       Prognosis Prognosis for Safe Diet Advancement: Fair Barriers to Reach Goals: Cognitive deficits;Time post onset;Behavior;Severity of deficits;Motivation Barriers/Prognosis Comment: risk for aspiration of REFLUX material sec to GERD and impulsive eating/drinking behaviors; baseline Advanced Dementia      Swallow Study   General Date of Onset: 09/13/21 HPI: Pt is a 88 y.o. female with history of Advanced Dementia w/ Impulsive eating behaviors, REFLUX(not on a PPI), hypertension prior stroke has been experiencing slurred speech since 09/09/21 - Daughter stated "much improved now"(09/14/21).   A day prior, patient had low back pain.  Patient also was recently placed on antibiotics for possible UTI.  Since weakness and speech changes were present, pt was brought to the ER. Per MD at admit, pt did not have any weakness of the extremities.    09/13/21 Brain MRI: 2 cm acute ischemic nonhemorrhagic left basal ganglia infarct.  A second CXR imagain revealed: "No active cardiopulmonary disease". Type of Study: Bedside Swallow Evaluation Previous Swallow Assessment: none Diet Prior to this Study: Regular;Thin liquids Temperature Spikes Noted: No (wbc 6.6) Respiratory Status: Room air History of Recent Intubation: No Behavior/Cognition: Alert;Cooperative;Pleasant mood;Confused;Distractible;Requires cueing (Impulsive) Oral Cavity Assessment: Within Functional Limits Oral Care Completed by SLP: Recent completion by staff Oral Cavity -  Dentition: Adequate natural dentition;Missing dentition (few) Vision: Functional for self-feeding Self-Feeding Abilities: Able  to feed self;Needs assist;Needs set up Patient Positioning: Upright in bed (needed support w/ positioning) Baseline Vocal Quality: Normal Volitional Cough: Strong Volitional Swallow: Unable to elicit    Oral/Motor/Sensory Function Overall Oral Motor/Sensory Function: Within functional limits   Ice Chips Ice chips: Within functional limits Presentation: Spoon (fed; 2 trials)   Thin Liquid Thin Liquid: Within functional limits Presentation: Cup;Self Fed (~4-5 ozs) Other Comments: no straw utilized; noted impulsive drinking which was monitored by this Clinician    Nectar Thick Nectar Thick Liquid: Not tested   Honey Thick Honey Thick Liquid: Not tested   Puree Puree: Within functional limits Presentation: Self Fed;Spoon (~3 ozs)   Solid     Solid: Within functional limits (moistened) Presentation: Self Fed (10+ trials)        Jerilynn Som, MS, CCC-SLP Speech Language Pathologist Rehab Services; [facility] [phone] (ascom) [place] 09/14/2021,4:37 PM

## 2021-09-14 NOTE — ED Notes (Signed)
PT at bedside.

## 2021-09-14 NOTE — ED Notes (Signed)
The pt's iv was wrapped with some cling wrap to keep her iv in place. The pt was also given a busy lap mat due to pt trying to pull off her leads and purwick.

## 2021-09-14 NOTE — Discharge Summary (Signed)
Physician Discharge Summary   Patient: Brooke Pierce MRN: [id number] DOB: 08-Dec-1931  Admit date:     09/13/2021  Discharge date: 09/14/21  Discharge Physician: Marrion Coyekui Everlyn Farabaugh   PCP: Bryson CoronaPierce, Patricia A, NP   Recommendations at discharge:   Follow-up with PCP in 1 week. Follow-up with neurology in 1 month.  Discharge Diagnoses: Principal Problem:   Acute CVA (cerebrovascular accident) [facility](HCC) Active Problems:   Hypertension   Dementia (HCC)  Resolved Problems:   * No resolved hospital problems. [facility]*  Hospital Course: Brooke LessMarjorie S Tippetts is a 88 y.o. female with history of advanced dementia, hypertension prior stroke has been experiencing slurred speech since June 11. Patient LDL was 111 with total cholesterol of 203, triglycerides 230. Telemetry did not show any arrhythmia. MRI of the brain showed left basal ganglion stroke. MRA of the head did not show any major occlusion. CT angiogram of the neck did not show significant occlusion but showed interstitial change in the lungs.  Patient does not have any respiratory symptoms at this time. Also had echocardiogram will be performed today, results will be pending at this moment. Patient has been seen by speech therapy, currently doing well without significant aphasia.  Evaluated by neurology, will be treated with aspirin, Plavix as well as Lipitor. The treatment plan is continue aspirin, discontinue Plavix after 21 days.   Assessment and Plan: Acute ischemic stroke in the right basal ganglia. Dyslipidemia. Essential hypertension. Dementia. The plan was described above. Patient be followed by neurology in 1 month and PCP in 1 week.       Consultants: Neurology Procedures performed: None  Disposition: Assisted living Diet recommendation:  Discharge Diet Orders (From admission, onward)     Start     Ordered   09/14/21 0000  Diet - low sodium heart healthy        09/14/21 1438           Dysphagia type 3 thin  Liquid DISCHARGE MEDICATION: Allergies as of 09/14/2021       Reactions   Calcium-containing Compounds Nausea And Vomiting   Coreg [carvedilol] Itching   Iodine         Medication List     STOP taking these medications    acetaminophen 650 MG CR tablet Commonly known as: Tylenol 8 Hour   cefdinir 300 MG capsule Commonly known as: OMNICEF       TAKE these medications    amLODipine 10 MG tablet Commonly known as: NORVASC Take 10 mg by mouth daily.   aspirin EC 81 MG tablet Take 1 tablet (81 mg total) by mouth daily. Swallow whole.   atorvastatin 40 MG tablet Commonly known as: Lipitor Take 1 tablet (40 mg total) by mouth daily.   clopidogrel 75 MG tablet Commonly known as: PLAVIX Take 75 mg by mouth daily.   D3 50 MCG (2000 UT) Tabs Generic drug: Cholecalciferol Take 1 tablet by mouth daily.   DULoxetine 30 MG capsule Commonly known as: CYMBALTA Take 30 mg by mouth daily. Take along with one 60 mg capsule for total 90 mg once daily   DULoxetine 60 MG capsule Commonly known as: CYMBALTA Take 60 mg by mouth daily. Take along with one 30 mg capsule for total 90 mg once daily   famotidine 20 MG tablet Commonly known as: Pepcid Take 1 tablet (20 mg total) by mouth daily.   latanoprost 0.005 % ophthalmic solution Commonly known as: XALATAN Place 1 drop into both eyes at bedtime.  lidocaine 5 % Commonly known as: Lidoderm Place 1 patch onto the skin daily for 15 days. Remove & Discard patch after 12 hours of wear each day.   losartan 100 MG tablet Commonly known as: COZAAR Take 100 mg by mouth daily.   nitrofurantoin 50 MG capsule Commonly known as: MACRODANTIN Take 50 mg by mouth daily.   Prolia 60 MG/ML Sosy injection Generic drug: denosumab Inject into the skin.   QUEtiapine 25 MG tablet Commonly known as: SEROQUEL Take 25 mg by mouth at bedtime.   Refresh Tears 0.5 % Soln Generic drug: carboxymethylcellulose Place 1 drop into both eyes  in the morning, at noon, in the evening, and at bedtime.   timolol 0.25 % ophthalmic solution Commonly known as: TIMOPTIC Place 1 drop into both eyes daily.        Follow-up Information     Consepcion Hearing A, NP Follow up in 1 week(s).   Specialty: Internal Medicine Contact information: [address] [place] [address] [phone]         [facility] Follow up in 1 month(s).   Contact information: [address] [place] [address] [phone]               Discharge Exam: There were no vitals filed for this visit. General exam: Appears calm and comfortable  Respiratory system: Clear to auscultation. Respiratory effort normal. Cardiovascular system: S1 & S2 heard, RRR. No JVD, murmurs, rubs, gallops or clicks. No pedal edema. Gastrointestinal system: Abdomen is nondistended, soft and nontender. No organomegaly or masses felt. Normal bowel sounds heard. Central nervous system: Alert and oriented x2. No focal neurological deficits. Extremities: Symmetric 5 x 5 power. Skin: No rashes, lesions or ulcers Psychiatry: Judgement and insight appear normal. Mood & affect appropriate.    Condition at discharge: good  The results of significant diagnostics from this hospitalization (including imaging, microbiology, ancillary and laboratory) are listed below for reference.   Imaging Studies: CT ANGIO NECK W OR WO CONTRAST  Result Date: 09/14/2021 CLINICAL DATA:  Follow-up examination for stroke. EXAM: CT ANGIOGRAPHY NECK TECHNIQUE: Multidetector CT imaging of the neck was performed using the standard protocol during bolus administration of intravenous contrast. Multiplanar CT image reconstructions and MIPs were obtained to evaluate the vascular anatomy. Carotid stenosis measurements (when applicable) are obtained utilizing NASCET criteria, using the distal internal carotid diameter as the denominator. RADIATION DOSE REDUCTION:  This exam was performed according to the departmental dose-optimization program which includes automated exposure control, adjustment of the mA and/or kV according to patient size and/or use of iterative reconstruction technique. CONTRAST:  25mL OMNIPAQUE IOHEXOL 350 MG/ML SOLN COMPARISON:  Prior MRI from 09/13/2021. FINDINGS: Aortic arch: Visualized aortic arch normal caliber with standard 3 vessel morphology. Mild for age plaque about the arch itself. No hemodynamically significant stenosis about the origin of the great vessels. Right carotid system: Right common and internal carotid arteries patent without stenosis or dissection. Minor for age atheromatous change about the right carotid bulb without stenosis. Left carotid system: Left common and internal carotid arteries patent without stenosis or dissection. Minor for age atheromatous change about the left carotid bulb without stenosis. Vertebral arteries: Both vertebral arteries arise from the subclavian arteries. No significant proximal subclavian artery stenosis. Vertebral arteries patent without stenosis or dissection. Skeleton: No discrete or worrisome osseous lesions. Mild for age degenerative spondylosis. Degenerative changes noted about the TMJs bilaterally. Other neck: No other acute soft tissue abnormality within the neck.  Incidental 1.5 cm left thyroid nodule noted. In the setting of significant comorbidities or limited life expectancy, no follow-up recommended (ref: J Am Coll Radiol. 2015 Feb;12(2): 143-50). Upper chest: Scattered ground-glass density noted within the partially visualized lungs, likely mild pulmonary interstitial edema. IMPRESSION: 1. Negative CTA of the neck. No hemodynamically significant stenosis or other acute vascular abnormality. 2. Scattered ground-glass density within the partially visualized lungs, likely mild pulmonary interstitial edema. Electronically Signed   By: Rise Mu M.D.   On: 09/14/2021 02:50   MR  BRAIN WO CONTRAST  Result Date: 09/13/2021 CLINICAL DATA:  Initial evaluation for neuro deficit, stroke suspected. EXAM: MRI HEAD WITHOUT CONTRAST MRA HEAD WITHOUT CONTRAST TECHNIQUE: Multiplanar, multi-echo pulse sequences of the brain and surrounding structures were acquired without intravenous contrast. Angiographic images of the Circle of Willis were acquired using MRA technique without intravenous contrast. COMPARISON:  Prior CT from earlier the same day. FINDINGS: MRI HEAD FINDINGS Brain: Age-related cerebral atrophy with moderately advanced chronic microvascular ischemic disease. Approximate 2 cm acute ischemic infarct seen involving the left basal ganglia. No associated hemorrhage or mass effect. No other evidence for acute or subacute ischemia. No other areas of chronic cortical infarction. No acute intracranial hemorrhage. Single punctate chronic microhemorrhage noted within the right cerebral hemisphere, of doubtful significance in isolation. No mass lesion, midline shift or mass effect. No hydrocephalus or extra-axial fluid collection. Pituitary gland suprasellar region within normal limits. Vascular: Major intracranial vascular flow voids are maintained. Skull and upper cervical spine: Basal junction normal. Bone marrow signal intensity normal. No scalp soft tissue abnormality. Sinuses/Orbits: Prior bilateral ocular lens replacement. Scattered mucosal thickening noted about the ethmoidal air cells and maxillary sinuses, greatest within the left maxillary sinus. No significant mastoid effusion. Other: None. MRA HEAD FINDINGS Anterior circulation: Examination degraded by motion artifact. Both internal carotid arteries patent to the termini without appreciable stenosis or other abnormality. A1 segments patent. Normal anterior communicating artery complex. Right ACA patent to its distal aspect without stenosis. Suspected moderate proximal left A2 stenosis (series 5, image 129). Left ACA otherwise patent  distally. No M1 stenosis or occlusion. No proximal MCA branch occlusion. Distal MCA branches perfused and symmetric. Posterior circulation: Both vertebral arteries patent without stenosis. Neither PICA well visualized. Basilar patent to its distal aspect without stenosis. Superior cerebellar arteries patent bilaterally. Both PCAs primarily supplied via the basilar. Right PCA well perfused to its distal aspect. Focal moderate proximal left P2 stenosis (series 1056, image 8). Left PCA otherwise patent. Anatomic variants: None significant.  No aneurysm. IMPRESSION: MRI HEAD IMPRESSION: 1. 2 cm acute ischemic nonhemorrhagic left basal ganglia infarct. 2. Underlying age-related cerebral atrophy with moderately advanced chronic microvascular ischemic disease. MRA HEAD IMPRESSION: 1. Negative intracranial MRA for large vessel occlusion. 2. Moderate proximal left A2 and P2 stenoses. 3. No other hemodynamically significant or correctable stenosis identified. Please note that while an MRA neck was also ordered as a part of this exam, this was unable to be performed as the patient was unable to tolerate the full length of the study. No charges for this should be applied to the patient. Electronically Signed   By: Rise Mu M.D.   On: 09/13/2021 23:19   MR Angiogram Neck W or Wo Contrast  Result Date: 09/13/2021 CLINICAL DATA:  Initial evaluation for neuro deficit, stroke suspected. EXAM: MRI HEAD WITHOUT CONTRAST MRA HEAD WITHOUT CONTRAST TECHNIQUE: Multiplanar, multi-echo pulse sequences of the brain and surrounding structures were acquired without intravenous contrast. Angiographic images of  the Circle of Willis were acquired using MRA technique without intravenous contrast. COMPARISON:  Prior CT from earlier the same day. FINDINGS: MRI HEAD FINDINGS Brain: Age-related cerebral atrophy with moderately advanced chronic microvascular ischemic disease. Approximate 2 cm acute ischemic infarct seen involving the  left basal ganglia. No associated hemorrhage or mass effect. No other evidence for acute or subacute ischemia. No other areas of chronic cortical infarction. No acute intracranial hemorrhage. Single punctate chronic microhemorrhage noted within the right cerebral hemisphere, of doubtful significance in isolation. No mass lesion, midline shift or mass effect. No hydrocephalus or extra-axial fluid collection. Pituitary gland suprasellar region within normal limits. Vascular: Major intracranial vascular flow voids are maintained. Skull and upper cervical spine: Basal junction normal. Bone marrow signal intensity normal. No scalp soft tissue abnormality. Sinuses/Orbits: Prior bilateral ocular lens replacement. Scattered mucosal thickening noted about the ethmoidal air cells and maxillary sinuses, greatest within the left maxillary sinus. No significant mastoid effusion. Other: None. MRA HEAD FINDINGS Anterior circulation: Examination degraded by motion artifact. Both internal carotid arteries patent to the termini without appreciable stenosis or other abnormality. A1 segments patent. Normal anterior communicating artery complex. Right ACA patent to its distal aspect without stenosis. Suspected moderate proximal left A2 stenosis (series 5, image 129). Left ACA otherwise patent distally. No M1 stenosis or occlusion. No proximal MCA branch occlusion. Distal MCA branches perfused and symmetric. Posterior circulation: Both vertebral arteries patent without stenosis. Neither PICA well visualized. Basilar patent to its distal aspect without stenosis. Superior cerebellar arteries patent bilaterally. Both PCAs primarily supplied via the basilar. Right PCA well perfused to its distal aspect. Focal moderate proximal left P2 stenosis (series 1056, image 8). Left PCA otherwise patent. Anatomic variants: None significant.  No aneurysm. IMPRESSION: MRI HEAD IMPRESSION: 1. 2 cm acute ischemic nonhemorrhagic left basal ganglia infarct.  2. Underlying age-related cerebral atrophy with moderately advanced chronic microvascular ischemic disease. MRA HEAD IMPRESSION: 1. Negative intracranial MRA for large vessel occlusion. 2. Moderate proximal left A2 and P2 stenoses. 3. No other hemodynamically significant or correctable stenosis identified. Please note that while an MRA neck was also ordered as a part of this exam, this was unable to be performed as the patient was unable to tolerate the full length of the study. No charges for this should be applied to the patient. Electronically Signed   By: Rise Mu M.D.   On: 09/13/2021 23:19   MR ANGIO HEAD WO CONTRAST  Result Date: 09/13/2021 CLINICAL DATA:  Initial evaluation for neuro deficit, stroke suspected. EXAM: MRI HEAD WITHOUT CONTRAST MRA HEAD WITHOUT CONTRAST TECHNIQUE: Multiplanar, multi-echo pulse sequences of the brain and surrounding structures were acquired without intravenous contrast. Angiographic images of the Circle of Willis were acquired using MRA technique without intravenous contrast. COMPARISON:  Prior CT from earlier the same day. FINDINGS: MRI HEAD FINDINGS Brain: Age-related cerebral atrophy with moderately advanced chronic microvascular ischemic disease. Approximate 2 cm acute ischemic infarct seen involving the left basal ganglia. No associated hemorrhage or mass effect. No other evidence for acute or subacute ischemia. No other areas of chronic cortical infarction. No acute intracranial hemorrhage. Single punctate chronic microhemorrhage noted within the right cerebral hemisphere, of doubtful significance in isolation. No mass lesion, midline shift or mass effect. No hydrocephalus or extra-axial fluid collection. Pituitary gland suprasellar region within normal limits. Vascular: Major intracranial vascular flow voids are maintained. Skull and upper cervical spine: Basal junction normal. Bone marrow signal intensity normal. No scalp soft tissue abnormality.  Sinuses/Orbits: Prior  bilateral ocular lens replacement. Scattered mucosal thickening noted about the ethmoidal air cells and maxillary sinuses, greatest within the left maxillary sinus. No significant mastoid effusion. Other: None. MRA HEAD FINDINGS Anterior circulation: Examination degraded by motion artifact. Both internal carotid arteries patent to the termini without appreciable stenosis or other abnormality. A1 segments patent. Normal anterior communicating artery complex. Right ACA patent to its distal aspect without stenosis. Suspected moderate proximal left A2 stenosis (series 5, image 129). Left ACA otherwise patent distally. No M1 stenosis or occlusion. No proximal MCA branch occlusion. Distal MCA branches perfused and symmetric. Posterior circulation: Both vertebral arteries patent without stenosis. Neither PICA well visualized. Basilar patent to its distal aspect without stenosis. Superior cerebellar arteries patent bilaterally. Both PCAs primarily supplied via the basilar. Right PCA well perfused to its distal aspect. Focal moderate proximal left P2 stenosis (series 1056, image 8). Left PCA otherwise patent. Anatomic variants: None significant.  No aneurysm. IMPRESSION: MRI HEAD IMPRESSION: 1. 2 cm acute ischemic nonhemorrhagic left basal ganglia infarct. 2. Underlying age-related cerebral atrophy with moderately advanced chronic microvascular ischemic disease. MRA HEAD IMPRESSION: 1. Negative intracranial MRA for large vessel occlusion. 2. Moderate proximal left A2 and P2 stenoses. 3. No other hemodynamically significant or correctable stenosis identified. Please note that while an MRA neck was also ordered as a part of this exam, this was unable to be performed as the patient was unable to tolerate the full length of the study. No charges for this should be applied to the patient. Electronically Signed   By: Rise Mu M.D.   On: 09/13/2021 23:19   DG Chest 2 View  Result Date:  09/13/2021 CLINICAL DATA:  Pneumonia. EXAM: CHEST - 2 VIEW COMPARISON:  Same day. FINDINGS: The heart size and mediastinal contours are within normal limits. Both lungs are clear. The visualized skeletal structures are unremarkable. IMPRESSION: No active cardiopulmonary disease. Electronically Signed   By: Lupita Raider M.D.   On: 09/13/2021 19:29   CT Lumbar Spine Wo Contrast  Result Date: 09/13/2021 CLINICAL DATA:  Chronic back pain EXAM: CT LUMBAR SPINE WITHOUT CONTRAST TECHNIQUE: Multidetector CT imaging of the lumbar spine was performed without intravenous contrast administration. Multiplanar CT image reconstructions were also generated. RADIATION DOSE REDUCTION: This exam was performed according to the departmental dose-optimization program which includes automated exposure control, adjustment of the mA and/or kV according to patient size and/or use of iterative reconstruction technique. COMPARISON:  No prior CT of the lumbar spine, correlation is made with 09/08/2021 lumbar spine radiographs FINDINGS: Segmentation: 5 lumbar type vertebrae. Alignment: Trace retrolisthesis L1 on L2, which appears degenerative. Mild levocurvature. Vertebrae: No acute fracture or suspicious osseous lesion. Multifocal endplate degenerative changes, predominantly in the lower thoracic and upper lumbar spine. Paraspinal and other soft tissues: Aortic atherosclerosis. Renal cysts. Disc levels: No high-grade spinal canal stenosis. Moderate bilateral neural foraminal narrowing at L1-L2. IMPRESSION: No acute fracture or traumatic listhesis. Electronically Signed   By: Wiliam Ke M.D.   On: 09/13/2021 18:50   DG Chest Portable 1 View  Result Date: 09/13/2021 CLINICAL DATA:  Confusion, infection suspected. EXAM: PORTABLE CHEST 1 VIEW COMPARISON:  None Available. FINDINGS: Upper normal heart size, likely accentuated by portable technique and low lung volumes.The cardiomediastinal contours are normal. Atherosclerosis of the  thoracic aorta. Patchy opacity at the left lung base. Pulmonary vasculature is normal. No pleural effusion or pneumothorax. No acute osseous abnormalities are seen. IMPRESSION: Patchy opacity at the left lung base may represent atelectasis or  pneumonia. Consider PA and lateral views of patient is able. Electronically Signed   By: Narda Rutherford M.D.   On: 09/13/2021 17:59   CT HEAD WO CONTRAST  Result Date: 09/13/2021 CLINICAL DATA:  Neuro deficit, acute, stroke suspected slurred speech EXAM: CT HEAD WITHOUT CONTRAST TECHNIQUE: Contiguous axial images were obtained from the base of the skull through the vertex without intravenous contrast. RADIATION DOSE REDUCTION: This exam was performed according to the departmental dose-optimization program which includes automated exposure control, adjustment of the mA and/or kV according to patient size and/or use of iterative reconstruction technique. COMPARISON:  None Available. FINDINGS: Brain: There is no acute intracranial hemorrhage, mass effect, or edema. Gray-white differentiation is preserved. There is no extra-axial fluid collection. Patchy and confluent hypoattenuation in the supratentorial white matter is nonspecific but may reflect moderate chronic microvascular ischemic changes. Prominence of the ventricles and sulci reflects parenchymal volume loss. Vascular: No hyperdense vessel. There is intracranial atherosclerotic calcification at the skull base including along the mid basilar. Skull: Calvarium is unremarkable. Sinuses/Orbits: No acute finding. Other: None. IMPRESSION: No acute intracranial abnormality. Moderate chronic microvascular ischemic changes. Electronically Signed   By: Guadlupe Spanish M.D.   On: 09/13/2021 16:49   DG Lumbar Spine Complete  Result Date: 09/08/2021 CLINICAL DATA:  Pt here for chronic back pain, states it has been getting worse. Pt has a hx of dementia. Denies any recent falls. States the pain is worse and she been having  trouble walking. Denies urinary symptoms. EXAM: LUMBAR SPINE - COMPLETE 4+ VIEW COMPARISON:  None Available. FINDINGS: No fracture.  No bone lesion. Grade 1 retrolisthesis of L1 and L2.  No other spondylolisthesis. Moderate loss of disc height at L1-L2 and L2-L3. Mild loss of disc height at L5-S1. Facet joint narrowing on the left at L4-L5 and L5-S1 and on the right at L3-L4, L4-L5 and L5-S1. Scattered aortic atherosclerotic calcifications. Soft tissues otherwise unremarkable. Three screws proximal left femur, incompletely imaged, from a prior left proximal femur fracture ORIF. IMPRESSION: 1. No fracture or acute finding. 2. Disc and facet degenerative changes as detailed. Electronically Signed   By: Amie Portland M.D.   On: 09/08/2021 15:52    Microbiology: No results found for this or any previous visit.  Labs: CBC: Recent Labs  Lab 09/08/21 1716 09/13/21 1610 09/14/21 0320  WBC 7.5 6.0 6.6  NEUTROABS 3.9 2.7  --   HGB 13.3 12.5 11.9*  HCT 41.5 39.5 37.2  MCV 87.9 89.6 88.4  PLT 323 323 288   Basic Metabolic Panel: Recent Labs  Lab 09/08/21 1716 09/13/21 1610 09/14/21 0320  NA 141 140  --   K 4.0 3.8  --   CL 109 109  --   CO2 25 23  --   GLUCOSE 113* 143*  --   BUN 26* 29*  --   CREATININE 0.88 0.94 0.80  CALCIUM 9.5 9.5  --    Liver Function Tests: Recent Labs  Lab 09/13/21 1610  AST 21  ALT 30  ALKPHOS 50  BILITOT 0.6  PROT 6.8  ALBUMIN 3.9   CBG: No results for input(s): "GLUCAP" in the last 168 hours.  Discharge time spent: less than 30 minutes.  Signed: Marrion Coy, MD [facility] 09/14/2021

## 2021-09-14 NOTE — ED Notes (Addendum)
While assisting in room 6 the pt's family friend called out to [facility] for some assistance. The friend stated the pt appeared to have choked on some coffee. The pt was sitting upright in the bed. The pt was warm, pink, and dry with no signs of resp.distress. The pt had clear bilateral lung sounds with no audible obstruction over the trachea. The pt was able to talk in full sentences at her baseline. The food was cleared from the room and Dr. Scotty Court was notified.

## 2021-09-14 NOTE — Care Management (Deleted)
06/16 RNCM attempted to see patient.  Patient not in room at the time of visit. 

## 2021-09-14 NOTE — TOC Initial Note (Signed)
Transition of Care [facility]) - Initial/Assessment Note    Patient Details  Name: Brooke Pierce MRN: [id number] Date of Birth: 1931/06/17  Transition of Care [facility] CM/SW Contact:    Caryn Section, RN Phone Number: 09/14/2021, 3:48 PM  Clinical Narrative:      Patient lives at [facility] per Daughter in [place], who will transport patient.  Daughter in law states that patient has a walker at facility.   When asked about HH, daughter in law states that [facility] has their own and will not allow outside agencies (RNCM offered [facility]) and that she would monitor Hh at facility.  Daughter in law asks for an order for OT PT and SLP at facility,  Hospitalist notified.          Expected Discharge Plan: Home w Home Health Services Barriers to Discharge: Barriers Resolved   Patient Goals and CMS Choice        Expected Discharge Plan and Services Expected Discharge Plan: Home w Home Health Services   Discharge Planning Services: CM Consult   Living arrangements for the past 2 months: Independent Living Facility Expected Discharge Date: 09/14/21                         HH Arranged: PT, OT          Prior Living Arrangements/Services Living arrangements for the past 2 months: Independent Living Facility Lives with:: Facility Resident Patient language and need for interpreter reviewed:: Yes (spoke to daughter in law, as patient not alert and oriented) Do you feel safe going back to the place where you live?: Yes      Need for Family Participation in Patient Care: Yes (Comment) Care giver support system in place?: Yes (comment)   Criminal Activity/Legal Involvement Pertinent to Current Situation/Hospitalization: No - Comment as needed  Activities of Daily Living Home Assistive Devices/Equipment: Walker (specify type), Cane (specify quad or straight) ADL Screening (condition at time of admission) Patient's cognitive ability adequate to safely complete daily activities?: Yes Is  the patient deaf or have difficulty hearing?: No Does the patient have difficulty seeing, even when wearing glasses/contacts?: No Does the patient have difficulty concentrating, remembering, or making decisions?: No Patient able to express need for assistance with ADLs?: Yes Does the patient have difficulty dressing or bathing?: Yes Independently performs ADLs?: No Communication: Independent Dressing (OT): Needs assistance Is this a change from baseline?: Pre-admission baseline Grooming: Needs assistance Is this a change from baseline?: Pre-admission baseline Feeding: Independent Bathing: Needs assistance Is this a change from baseline?: Pre-admission baseline Toileting: Needs assistance Is this a change from baseline?: Pre-admission baseline In/Out Bed: Independent with device (comment) Is this a change from baseline?: Pre-admission baseline Walks in Home: Independent with device (comment) Does the patient have difficulty walking or climbing stairs?: Yes Weakness of Legs: Both Weakness of Arms/Hands: None  Permission Sought/Granted Permission sought to share information with : Case Manager Permission granted to share information with : Yes, Verbal Permission Granted              Emotional Assessment Appearance:: Appears stated age     Orientation: : Oriented to Self Alcohol / Substance Use: Not Applicable Psych Involvement: No (comment)  Admission diagnosis:  Acute CVA (cerebrovascular accident) [facility]) [I63.9] Patient Active Problem List   Diagnosis Date Noted   Acute CVA (cerebrovascular accident) (HCC) 09/14/2021   Hypertension 09/14/2021   Dementia (HCC) 09/14/2021   PCP:  Bryson Corona,  NP Pharmacy:   [facility] [place], [place] - [address] [place] [address] Phone: [phone] Fax: [phone]     Social Determinants of Health (SDOH) Interventions    Readmission Risk Interventions     No data to display

## 2021-09-14 NOTE — H&P (Signed)
History and Physical    ADRIEANA FENNELLY Pierce:[id number] DOB: 10/07/1931 DOA: 09/13/2021  PCP: Bryson Corona, NP  Patient coming from: Assisted living facility.  Chief Complaint: Slurred speech.  History obtained from patient's daughter-in-law.  HPI: Brooke Pierce is a 88 y.o. female with history of advanced dementia, hypertension prior stroke has been experiencing slurred speech since June 11.  A day prior to that patient had low back pain.  Patient also was recently placed on antibiotics for possible UTI.  Since slurred speech persisted patient was brought to the ER.  Did not have any weakness of the extremities.  ED Course: In the ER MRI of the brain confirms acute CVA.  Admitted for further work-up.  Patient did pass stroke swallow.  Patient at the time of my exam is only oriented to name and per patient's daughter-in-law at the bedside this is her baseline.  Review of Systems: As per HPI, rest all negative.   Past Medical History:  Diagnosis Date   Anxiety    Depression    Glaucoma    HTN (hypertension)    Insomnia    Stroke [facility])    Vascular dementia (HCC)     History reviewed. No pertinent surgical history.   reports that she has never smoked. She has never used smokeless tobacco. No history on file for alcohol use and drug use.  Allergies  Allergen Reactions   Calcium-Containing Compounds Nausea And Vomiting   Coreg [Carvedilol] Itching   Iodine     Family History  Problem Relation Age of Onset   Dementia Brother     Prior to Admission medications   Medication Sig Start Date End Date Taking? Authorizing Provider  PROLIA 60 MG/ML SOSY injection Inject into the skin. 08/10/21  Yes [provider]  acetaminophen (TYLENOL 8 HOUR) 650 MG CR tablet Take 1 tablet (650 mg total) by mouth 2 (two) times daily. 09/08/21 10/08/21  Menshew, Charlesetta Ivory, PA-C  amLODipine (NORVASC) 10 MG tablet Take 10 mg by mouth daily. 08/22/21   [provider]   cefdinir (OMNICEF) 300 MG capsule Take 300 mg by mouth 2 (two) times daily. 09/12/21   [provider]  clopidogrel (PLAVIX) 75 MG tablet Take 75 mg by mouth daily. 08/22/21   [provider]  D3 50 MCG (2000 UT) TABS Take 1 tablet by mouth daily. 08/22/21   [provider]  DULoxetine (CYMBALTA) 30 MG capsule Take 30 mg by mouth daily. 08/22/21   [provider]  DULoxetine (CYMBALTA) 60 MG capsule Take 60 mg by mouth daily. 08/22/21   [provider]  famotidine (PEPCID) 20 MG tablet Take 1 tablet (20 mg total) by mouth daily. 09/08/21 03/07/22  Menshew, Charlesetta Ivory, PA-C  latanoprost (XALATAN) 0.005 % ophthalmic solution Place 1 drop into both eyes at bedtime. 08/07/21   [provider]  lidocaine (LIDODERM) 5 % Place 1 patch onto the skin daily for 15 days. Remove & Discard patch after 12 hours of wear each day. 09/08/21 09/23/21  Menshew, Charlesetta Ivory, PA-C  losartan (COZAAR) 100 MG tablet Take 100 mg by mouth daily. 08/22/21   [provider]  nitrofurantoin (MACRODANTIN) 50 MG capsule Take 50 mg by mouth daily. 08/22/21   [provider]  QUEtiapine (SEROQUEL) 25 MG tablet Take 25 mg by mouth at bedtime. 08/22/21   [provider]  timolol (TIMOPTIC) 0.25 % ophthalmic solution Place 1 drop into both eyes daily. 08/07/21  [provider]    Physical Exam: Constitutional: Moderately built and nourished. Vitals:   09/13/21 1730 09/13/21 1800 09/13/21 1932 09/13/21 2000  BP: (!) 176/58 (!) 146/109 127/90 (!) 151/92  Pulse: 67 68 66 66  Resp: 17 17 18 18   Temp:      TempSrc:      SpO2: 96% 99% 97% 95%   Eyes: Anicteric no pallor. ENMT: No discharge from the ears eyes nose and mouth. Neck: No mass felt.  On the clarity. Respiratory: No rhonchi or crepitations. Cardiovascular: S1-S2 heard. Abdomen: Soft nontender bowel sound present. Musculoskeletal: No edema. Skin: No rash. Neurologic: Alert awake  oriented to person moving all extremities 5 x 5.  No facial asymmetries tongue is midline pupils equal and reactive to light. Psychiatric: Oriented to name.   Labs on Admission: I have personally reviewed following labs and imaging studies  CBC: Recent Labs  Lab 09/08/21 1716 09/13/21 1610  WBC 7.5 6.0  NEUTROABS 3.9 2.7  HGB 13.3 12.5  HCT 41.5 39.5  MCV 87.9 89.6  PLT 323 323   Basic Metabolic Panel: Recent Labs  Lab 09/08/21 1716 09/13/21 1610  NA 141 140  K 4.0 3.8  CL 109 109  CO2 25 23  GLUCOSE 113* 143*  BUN 26* 29*  CREATININE 0.88 0.94  CALCIUM 9.5 9.5   GFR: CrCl cannot be calculated (Unknown ideal weight.). Liver Function Tests: Recent Labs  Lab 09/13/21 1610  AST 21  ALT 30  ALKPHOS 50  BILITOT 0.6  PROT 6.8  ALBUMIN 3.9   No results for input(s): "LIPASE", "AMYLASE" in the last 168 hours. No results for input(s): "AMMONIA" in the last 168 hours. Coagulation Profile: Recent Labs  Lab 09/13/21 1610  INR 1.0   Cardiac Enzymes: No results for input(s): "CKTOTAL", "CKMB", "CKMBINDEX", "TROPONINI" in the last 168 hours. BNP (last 3 results) No results for input(s): "PROBNP" in the last 8760 hours. HbA1C: No results for input(s): "HGBA1C" in the last 72 hours. CBG: No results for input(s): "GLUCAP" in the last 168 hours. Lipid Profile: No results for input(s): "CHOL", "HDL", "LDLCALC", "TRIG", "CHOLHDL", "LDLDIRECT" in the last 72 hours. Thyroid Function Tests: No results for input(s): "TSH", "T4TOTAL", "FREET4", "T3FREE", "THYROIDAB" in the last 72 hours. Anemia Panel: No results for input(s): "VITAMINB12", "FOLATE", "FERRITIN", "TIBC", "IRON", "RETICCTPCT" in the last 72 hours. Urine analysis:    Component Value Date/Time   COLORURINE YELLOW (A) 09/08/2021 1705   APPEARANCEUR CLEAR (A) 09/08/2021 1705   LABSPEC 1.019 09/08/2021 1705   PHURINE 6.0 09/08/2021 1705   GLUCOSEU NEGATIVE 09/08/2021 1705   HGBUR NEGATIVE 09/08/2021 1705    BILIRUBINUR NEGATIVE 09/08/2021 1705   KETONESUR NEGATIVE 09/08/2021 1705   PROTEINUR 30 (A) 09/08/2021 1705   NITRITE NEGATIVE 09/08/2021 1705   LEUKOCYTESUR NEGATIVE 09/08/2021 1705   Sepsis Labs: @LABRCNTIP (procalcitonin:4,lacticidven:4) )No results found for this or any previous visit (from the past 240 hour(s)).   Radiological Exams on Admission: MR BRAIN WO CONTRAST  Result Date: 09/13/2021 CLINICAL DATA:  Initial evaluation for neuro deficit, stroke suspected. EXAM: MRI HEAD WITHOUT CONTRAST MRA HEAD WITHOUT CONTRAST TECHNIQUE: Multiplanar, multi-echo pulse sequences of the brain and surrounding structures were acquired without intravenous contrast. Angiographic images of the Circle of Willis were acquired using MRA technique without intravenous contrast. COMPARISON:  Prior CT from earlier the same day. FINDINGS: MRI HEAD FINDINGS Brain: Age-related cerebral atrophy with moderately advanced chronic microvascular ischemic disease. Approximate 2 cm acute ischemic infarct seen involving the left  basal ganglia. No associated hemorrhage or mass effect. No other evidence for acute or subacute ischemia. No other areas of chronic cortical infarction. No acute intracranial hemorrhage. Single punctate chronic microhemorrhage noted within the right cerebral hemisphere, of doubtful significance in isolation. No mass lesion, midline shift or mass effect. No hydrocephalus or extra-axial fluid collection. Pituitary gland suprasellar region within normal limits. Vascular: Major intracranial vascular flow voids are maintained. Skull and upper cervical spine: Basal junction normal. Bone marrow signal intensity normal. No scalp soft tissue abnormality. Sinuses/Orbits: Prior bilateral ocular lens replacement. Scattered mucosal thickening noted about the ethmoidal air cells and maxillary sinuses, greatest within the left maxillary sinus. No significant mastoid effusion. Other: None. MRA HEAD FINDINGS Anterior  circulation: Examination degraded by motion artifact. Both internal carotid arteries patent to the termini without appreciable stenosis or other abnormality. A1 segments patent. Normal anterior communicating artery complex. Right ACA patent to its distal aspect without stenosis. Suspected moderate proximal left A2 stenosis (series 5, image 129). Left ACA otherwise patent distally. No M1 stenosis or occlusion. No proximal MCA branch occlusion. Distal MCA branches perfused and symmetric. Posterior circulation: Both vertebral arteries patent without stenosis. Neither PICA well visualized. Basilar patent to its distal aspect without stenosis. Superior cerebellar arteries patent bilaterally. Both PCAs primarily supplied via the basilar. Right PCA well perfused to its distal aspect. Focal moderate proximal left P2 stenosis (series 1056, image 8). Left PCA otherwise patent. Anatomic variants: None significant.  No aneurysm. IMPRESSION: MRI HEAD IMPRESSION: 1. 2 cm acute ischemic nonhemorrhagic left basal ganglia infarct. 2. Underlying age-related cerebral atrophy with moderately advanced chronic microvascular ischemic disease. MRA HEAD IMPRESSION: 1. Negative intracranial MRA for large vessel occlusion. 2. Moderate proximal left A2 and P2 stenoses. 3. No other hemodynamically significant or correctable stenosis identified. Please note that while an MRA neck was also ordered as a part of this exam, this was unable to be performed as the patient was unable to tolerate the full length of the study. No charges for this should be applied to the patient. Electronically Signed   By: Rise Mu M.D.   On: 09/13/2021 23:19   MR Angiogram Neck W or Wo Contrast  Result Date: 09/13/2021 CLINICAL DATA:  Initial evaluation for neuro deficit, stroke suspected. EXAM: MRI HEAD WITHOUT CONTRAST MRA HEAD WITHOUT CONTRAST TECHNIQUE: Multiplanar, multi-echo pulse sequences of the brain and surrounding structures were acquired  without intravenous contrast. Angiographic images of the Circle of Willis were acquired using MRA technique without intravenous contrast. COMPARISON:  Prior CT from earlier the same day. FINDINGS: MRI HEAD FINDINGS Brain: Age-related cerebral atrophy with moderately advanced chronic microvascular ischemic disease. Approximate 2 cm acute ischemic infarct seen involving the left basal ganglia. No associated hemorrhage or mass effect. No other evidence for acute or subacute ischemia. No other areas of chronic cortical infarction. No acute intracranial hemorrhage. Single punctate chronic microhemorrhage noted within the right cerebral hemisphere, of doubtful significance in isolation. No mass lesion, midline shift or mass effect. No hydrocephalus or extra-axial fluid collection. Pituitary gland suprasellar region within normal limits. Vascular: Major intracranial vascular flow voids are maintained. Skull and upper cervical spine: Basal junction normal. Bone marrow signal intensity normal. No scalp soft tissue abnormality. Sinuses/Orbits: Prior bilateral ocular lens replacement. Scattered mucosal thickening noted about the ethmoidal air cells and maxillary sinuses, greatest within the left maxillary sinus. No significant mastoid effusion. Other: None. MRA HEAD FINDINGS Anterior circulation: Examination degraded by motion artifact. Both internal carotid arteries patent to the  termini without appreciable stenosis or other abnormality. A1 segments patent. Normal anterior communicating artery complex. Right ACA patent to its distal aspect without stenosis. Suspected moderate proximal left A2 stenosis (series 5, image 129). Left ACA otherwise patent distally. No M1 stenosis or occlusion. No proximal MCA branch occlusion. Distal MCA branches perfused and symmetric. Posterior circulation: Both vertebral arteries patent without stenosis. Neither PICA well visualized. Basilar patent to its distal aspect without stenosis. Superior  cerebellar arteries patent bilaterally. Both PCAs primarily supplied via the basilar. Right PCA well perfused to its distal aspect. Focal moderate proximal left P2 stenosis (series 1056, image 8). Left PCA otherwise patent. Anatomic variants: None significant.  No aneurysm. IMPRESSION: MRI HEAD IMPRESSION: 1. 2 cm acute ischemic nonhemorrhagic left basal ganglia infarct. 2. Underlying age-related cerebral atrophy with moderately advanced chronic microvascular ischemic disease. MRA HEAD IMPRESSION: 1. Negative intracranial MRA for large vessel occlusion. 2. Moderate proximal left A2 and P2 stenoses. 3. No other hemodynamically significant or correctable stenosis identified. Please note that while an MRA neck was also ordered as a part of this exam, this was unable to be performed as the patient was unable to tolerate the full length of the study. No charges for this should be applied to the patient. Electronically Signed   By: Rise Mu M.D.   On: 09/13/2021 23:19   MR ANGIO HEAD WO CONTRAST  Result Date: 09/13/2021 CLINICAL DATA:  Initial evaluation for neuro deficit, stroke suspected. EXAM: MRI HEAD WITHOUT CONTRAST MRA HEAD WITHOUT CONTRAST TECHNIQUE: Multiplanar, multi-echo pulse sequences of the brain and surrounding structures were acquired without intravenous contrast. Angiographic images of the Circle of Willis were acquired using MRA technique without intravenous contrast. COMPARISON:  Prior CT from earlier the same day. FINDINGS: MRI HEAD FINDINGS Brain: Age-related cerebral atrophy with moderately advanced chronic microvascular ischemic disease. Approximate 2 cm acute ischemic infarct seen involving the left basal ganglia. No associated hemorrhage or mass effect. No other evidence for acute or subacute ischemia. No other areas of chronic cortical infarction. No acute intracranial hemorrhage. Single punctate chronic microhemorrhage noted within the right cerebral hemisphere, of doubtful  significance in isolation. No mass lesion, midline shift or mass effect. No hydrocephalus or extra-axial fluid collection. Pituitary gland suprasellar region within normal limits. Vascular: Major intracranial vascular flow voids are maintained. Skull and upper cervical spine: Basal junction normal. Bone marrow signal intensity normal. No scalp soft tissue abnormality. Sinuses/Orbits: Prior bilateral ocular lens replacement. Scattered mucosal thickening noted about the ethmoidal air cells and maxillary sinuses, greatest within the left maxillary sinus. No significant mastoid effusion. Other: None. MRA HEAD FINDINGS Anterior circulation: Examination degraded by motion artifact. Both internal carotid arteries patent to the termini without appreciable stenosis or other abnormality. A1 segments patent. Normal anterior communicating artery complex. Right ACA patent to its distal aspect without stenosis. Suspected moderate proximal left A2 stenosis (series 5, image 129). Left ACA otherwise patent distally. No M1 stenosis or occlusion. No proximal MCA branch occlusion. Distal MCA branches perfused and symmetric. Posterior circulation: Both vertebral arteries patent without stenosis. Neither PICA well visualized. Basilar patent to its distal aspect without stenosis. Superior cerebellar arteries patent bilaterally. Both PCAs primarily supplied via the basilar. Right PCA well perfused to its distal aspect. Focal moderate proximal left P2 stenosis (series 1056, image 8). Left PCA otherwise patent. Anatomic variants: None significant.  No aneurysm. IMPRESSION: MRI HEAD IMPRESSION: 1. 2 cm acute ischemic nonhemorrhagic left basal ganglia infarct. 2. Underlying age-related cerebral atrophy with moderately advanced  chronic microvascular ischemic disease. MRA HEAD IMPRESSION: 1. Negative intracranial MRA for large vessel occlusion. 2. Moderate proximal left A2 and P2 stenoses. 3. No other hemodynamically significant or correctable  stenosis identified. Please note that while an MRA neck was also ordered as a part of this exam, this was unable to be performed as the patient was unable to tolerate the full length of the study. No charges for this should be applied to the patient. Electronically Signed   By: Rise Mu M.D.   On: 09/13/2021 23:19   DG Chest 2 View  Result Date: 09/13/2021 CLINICAL DATA:  Pneumonia. EXAM: CHEST - 2 VIEW COMPARISON:  Same day. FINDINGS: The heart size and mediastinal contours are within normal limits. Both lungs are clear. The visualized skeletal structures are unremarkable. IMPRESSION: No active cardiopulmonary disease. Electronically Signed   By: Lupita Raider M.D.   On: 09/13/2021 19:29   CT Lumbar Spine Wo Contrast  Result Date: 09/13/2021 CLINICAL DATA:  Chronic back pain EXAM: CT LUMBAR SPINE WITHOUT CONTRAST TECHNIQUE: Multidetector CT imaging of the lumbar spine was performed without intravenous contrast administration. Multiplanar CT image reconstructions were also generated. RADIATION DOSE REDUCTION: This exam was performed according to the departmental dose-optimization program which includes automated exposure control, adjustment of the mA and/or kV according to patient size and/or use of iterative reconstruction technique. COMPARISON:  No prior CT of the lumbar spine, correlation is made with 09/08/2021 lumbar spine radiographs FINDINGS: Segmentation: 5 lumbar type vertebrae. Alignment: Trace retrolisthesis L1 on L2, which appears degenerative. Mild levocurvature. Vertebrae: No acute fracture or suspicious osseous lesion. Multifocal endplate degenerative changes, predominantly in the lower thoracic and upper lumbar spine. Paraspinal and other soft tissues: Aortic atherosclerosis. Renal cysts. Disc levels: No high-grade spinal canal stenosis. Moderate bilateral neural foraminal narrowing at L1-L2. IMPRESSION: No acute fracture or traumatic listhesis. Electronically Signed   By:  Wiliam Ke M.D.   On: 09/13/2021 18:50   DG Chest Portable 1 View  Result Date: 09/13/2021 CLINICAL DATA:  Confusion, infection suspected. EXAM: PORTABLE CHEST 1 VIEW COMPARISON:  None Available. FINDINGS: Upper normal heart size, likely accentuated by portable technique and low lung volumes.The cardiomediastinal contours are normal. Atherosclerosis of the thoracic aorta. Patchy opacity at the left lung base. Pulmonary vasculature is normal. No pleural effusion or pneumothorax. No acute osseous abnormalities are seen. IMPRESSION: Patchy opacity at the left lung base may represent atelectasis or pneumonia. Consider PA and lateral views of patient is able. Electronically Signed   By: Narda Rutherford M.D.   On: 09/13/2021 17:59   CT HEAD WO CONTRAST  Result Date: 09/13/2021 CLINICAL DATA:  Neuro deficit, acute, stroke suspected slurred speech EXAM: CT HEAD WITHOUT CONTRAST TECHNIQUE: Contiguous axial images were obtained from the base of the skull through the vertex without intravenous contrast. RADIATION DOSE REDUCTION: This exam was performed according to the departmental dose-optimization program which includes automated exposure control, adjustment of the mA and/or kV according to patient size and/or use of iterative reconstruction technique. COMPARISON:  None Available. FINDINGS: Brain: There is no acute intracranial hemorrhage, mass effect, or edema. Gray-white differentiation is preserved. There is no extra-axial fluid collection. Patchy and confluent hypoattenuation in the supratentorial white matter is nonspecific but may reflect moderate chronic microvascular ischemic changes. Prominence of the ventricles and sulci reflects parenchymal volume loss. Vascular: No hyperdense vessel. There is intracranial atherosclerotic calcification at the skull base including along the mid basilar. Skull: Calvarium is unremarkable. Sinuses/Orbits: No acute finding. Other:  None. IMPRESSION: No acute intracranial  abnormality. Moderate chronic microvascular ischemic changes. Electronically Signed   By: Guadlupe SpanishPraneil  Patel M.D.   On: 09/13/2021 16:49    EKG: Independently reviewed.  Normal sinus rhythm.  Assessment/Plan Principal Problem:   Acute CVA (cerebrovascular accident) [facility](HCC) Active Problems:   Hypertension   Dementia (HCC)    Acute CVA -symptoms have been there almost 5 days now.  Patient did pass stroke swallow.  CT angiogram of the neck and 2D echo is pending.  MRA of the head did not show any large vessel obstruction.  We will consult neurology.  Keep patient on neurochecks.  Check hemoglobin A1c lipid panel.  Add aspirin to Plavix. Hypertension since patient's symptoms have been present for more than 5 days patient is beyond permissive hypertension. History of recurrent UTI on chronic suppressive therapy with nitrofurantoin. History of dementia.   DVT prophylaxis: Lovenox. Code Status: Full code. Family Communication: Patient's daughter-in-law is also the healthcare power of attorney. Disposition Plan: Back to facility when stable. Consults called: We will consult neurology. Admission status: Observation.   Eduard ClosArshad N Ylianna Almanzar MD [facility] Pager [phone].  If 7PM-7AM, please contact night-coverage [email or web address] Password [email or web address]  09/14/2021, 1:18 AM

## 2021-09-14 NOTE — Evaluation (Signed)
Physical Therapy Evaluation Patient Details Name: Brooke Pierce MRN: [id number] DOB: [birth year] Today's Date: 09/14/2021  History of Present Illness  Brooke Pierce is a 88 y.o. female with history of advanced dementia, hypertension prior stroke has been experiencing slurred speech since June 11. 09/13/21 Brain MRI: 2 cm acute ischemic nonhemorrhagic left basal ganglia infarct.  Clinical Impression  Pt supine in bed, agreeable to therapy; friend in room. Daughter-in-law enters at end of session. Pt lives at [facility]; she requires assist with ADLs and is Mod I with transfers/ambulation using SPC. Pt presents with equal Pierce bilaterally in both upper and lower extremities. She denies altered sensation. Pt ambulated using IV pole; initially holding on with BUE due to mild instability, this improved to single UE (right) support mid-ambulation. Upon initial stand, pt demonstrated posterior lean with PT cueing for anterior weight shift. Limited response to cuing; natural anterior weight shift occurred during ambulation and pt maintained this improved stability for remainder of session. PT assisted with chopping food at end of session as DIL states pt must be on chopped diet. RN notified. Pt would benefit from continued PT to address balance deficits in order to maximize safety at d/c. Recommending HHPT.      Recommendations for follow up therapy are one component of a multi-disciplinary discharge planning process, led by the attending physician.  Recommendations may be updated based on patient status, additional functional criteria and insurance authorization.  Follow Up Recommendations Home health PT    Assistance Recommended at Discharge Intermittent Supervision/Assistance  Patient can return home with the following  A little help with walking and/or transfers;A little help with bathing/dressing/bathroom;Assistance with cooking/housework;Direct supervision/assist for financial  management;Assist for transportation    Equipment Recommendations None recommended by PT  Recommendations for Other Services       Functional Status Assessment Patient has had a recent decline in their functional status and demonstrates the ability to make significant improvements in function in a reasonable and predictable amount of time.     Precautions / Restrictions Precautions Precautions: Fall Restrictions Weight Bearing Restrictions: No      Mobility  Bed Mobility Overal bed mobility: Modified Independent                  Transfers Overall transfer level: Needs assistance Equipment used: 1 person hand held assist Transfers: Sit to/from Stand Sit to Stand: Min guard, From elevated surface           General transfer comment: from ER bed; demonstrates mild posterior lean for initial minute of standing; CGA to prevent LOB    Ambulation/Gait Ambulation/Gait assistance: Min guard Gait Distance (Feet): 150 Feet Assistive device: IV Pole Gait Pattern/deviations: Step-through pattern, Decreased stride length, Trunk flexed Gait velocity: decreased     General Gait Details: BUE support on IV pole initially; once balance improved decreased to RUE support only; assist progressed from CGA to SUP  Stairs            Wheelchair Mobility    Modified Rankin (Stroke Patients Only)       Balance Overall balance assessment: Needs assistance Sitting-balance support: No upper extremity supported Sitting balance-Leahy Scale: Good   Postural control: Posterior lean Standing balance support: During functional activity, Single extremity supported Standing balance-Leahy Scale: Fair Standing balance comment: did require CGA to steady upon initial stand; improved with ambulation  Pertinent Vitals/Pain Pain Assessment Pain Assessment: No/denies pain    Home Living Family/patient expects to be discharged to:: Assisted  living                 Home Equipment: Agricultural consultant (2 wheels);Cane - single point Additional Comments: [employer]    Prior Function Prior Level of Function : Needs assist             Mobility Comments: Uses SPC at baseline; ambulates through ALF Mod I (friend in room confirmed) ADLs Comments: per friend in room, pt refuses showers, assist for meals/meds     Hand Dominance   Dominant Hand: Right    Extremity/Trunk Assessment   Upper Extremity Assessment Upper Extremity Assessment: Overall WFL for tasks assessed    Lower Extremity Assessment Lower Extremity Assessment: Overall WFL for tasks assessed (equal Pierce bilaterally except LLE DF 4-/5; all others 4+ to 5/5)       Communication   Communication: No difficulties  Cognition Arousal/Alertness: Awake/alert Behavior During Therapy: WFL for tasks assessed/performed Overall Cognitive Status: History of cognitive impairments - at baseline                                 General Comments: A&Ox2; states hospital but unaware of town/specific facility and unable to state situation        General Comments      Exercises     Assessment/Plan    PT Assessment Patient needs continued PT services  PT Problem List Decreased mobility;Decreased safety awareness;Decreased activity tolerance;Decreased balance;Decreased cognition       PT Treatment Interventions Gait training;Therapeutic exercise;DME instruction;Balance training;Neuromuscular re-education;Therapeutic activities;Patient/family education;Functional mobility training    PT Goals (Current goals can be found in the Care Plan section)  Acute Rehab PT Goals PT Goal Formulation: Patient unable to participate in goal setting    Frequency Min 2X/week     Co-evaluation               AM-PAC PT "6 Clicks" Mobility  Outcome Measure Help needed turning from your back to your side while in a flat bed without using bedrails?:  None Help needed moving from lying on your back to sitting on the side of a flat bed without using bedrails?: None Help needed moving to and from a bed to a chair (including a wheelchair)?: A Little Help needed standing up from a chair using your arms (e.g., wheelchair or bedside chair)?: A Little Help needed to walk in hospital room?: A Little Help needed climbing 3-5 steps with a railing? : A Little 6 Click Score: 20    End of Session Equipment Utilized During Treatment: Gait belt Activity Tolerance: Patient tolerated treatment well Patient left: in chair;with family/visitor present Nurse Communication: Mobility status PT Visit Diagnosis: Unsteadiness on feet (R26.81);History of falling (Z91.81)    Time: 5397-6734 PT Time Calculation (min) (ACUTE ONLY): 24 min   Charges:   PT Evaluation $PT Eval Moderate Complexity: 1 Mod PT Treatments $Therapeutic Activity: 8-22 mins        Basilia Jumbo PT, DPT

## 2021-09-14 NOTE — ED Notes (Signed)
Dr. Zhang at bedside.  

## 2021-11-18 ENCOUNTER — Other Ambulatory Visit: Payer: Self-pay

## 2021-11-18 ENCOUNTER — Emergency Department: Payer: Medicare PPO

## 2021-11-18 ENCOUNTER — Emergency Department
Admission: EM | Admit: 2021-11-18 | Discharge: 2021-11-19 | Disposition: A | Discharge status: Other Acute Inpatient Hospital | Payer: Medicare PPO | Attending: Emergency Medicine | Admitting: Emergency Medicine

## 2021-11-18 DIAGNOSIS — U071 COVID-19: Secondary | ICD-10-CM | POA: Diagnosis not present

## 2021-11-18 DIAGNOSIS — F0390 Unspecified dementia without behavioral disturbance: Secondary | ICD-10-CM | POA: Insufficient documentation

## 2021-11-18 DIAGNOSIS — I10 Essential (primary) hypertension: Secondary | ICD-10-CM | POA: Diagnosis not present

## 2021-11-18 DIAGNOSIS — R059 Cough, unspecified: Secondary | ICD-10-CM | POA: Diagnosis present

## 2021-11-18 DIAGNOSIS — R778 Other specified abnormalities of plasma proteins: Secondary | ICD-10-CM | POA: Diagnosis not present

## 2021-11-18 LAB — CBC WITH DIFFERENTIAL/PLATELET
Abs Immature Granulocytes: 0.02 10*3/uL (ref 0.00–0.07)
Basophils Absolute: 0 10*3/uL (ref 0.0–0.1)
Basophils Relative: 1 %
Eosinophils Absolute: 0 10*3/uL (ref 0.0–0.5)
Eosinophils Relative: 0 %
HCT: 37.2 % (ref 36.0–46.0)
Hemoglobin: 11.9 g/dL — ABNORMAL LOW (ref 12.0–15.0)
Immature Granulocytes: 0 %
Lymphocytes Relative: 22 %
Lymphs Abs: 1.5 10*3/uL (ref 0.7–4.0)
MCH: 28.2 pg (ref 26.0–34.0)
MCHC: 32 g/dL (ref 30.0–36.0)
MCV: 88.2 fL (ref 80.0–100.0)
Monocytes Absolute: 1.2 10*3/uL — ABNORMAL HIGH (ref 0.1–1.0)
Monocytes Relative: 18 %
Neutro Abs: 3.9 10*3/uL (ref 1.7–7.7)
Neutrophils Relative %: 59 %
Platelets: 211 10*3/uL (ref 150–400)
RBC: 4.22 MIL/uL (ref 3.87–5.11)
RDW: 12.4 % (ref 11.5–15.5)
WBC: 6.7 10*3/uL (ref 4.0–10.5)
nRBC: 0 % (ref 0.0–0.2)

## 2021-11-18 LAB — COMPREHENSIVE METABOLIC PANEL
ALT: 26 U/L (ref 0–44)
AST: 29 U/L (ref 15–41)
Albumin: 3.7 g/dL (ref 3.5–5.0)
Alkaline Phosphatase: 56 U/L (ref 38–126)
Anion gap: 8 (ref 5–15)
BUN: 20 mg/dL (ref 8–23)
CO2: 26 mmol/L (ref 22–32)
Calcium: 8.9 mg/dL (ref 8.9–10.3)
Chloride: 103 mmol/L (ref 98–111)
Creatinine, Ser: 1.09 mg/dL — ABNORMAL HIGH (ref 0.44–1.00)
GFR, Estimated: 49 mL/min — ABNORMAL LOW (ref >60)
Glucose, Bld: 140 mg/dL — ABNORMAL HIGH (ref 70–99)
Potassium: 3.5 mmol/L (ref 3.5–5.1)
Sodium: 137 mmol/L (ref 135–145)
Total Bilirubin: 0.8 mg/dL (ref 0.3–1.2)
Total Protein: 6.8 g/dL (ref 6.5–8.1)

## 2021-11-18 LAB — URINALYSIS, ROUTINE W REFLEX MICROSCOPIC
Bilirubin Urine: NEGATIVE
Glucose, UA: NEGATIVE mg/dL
Hgb urine dipstick: NEGATIVE
Ketones, ur: NEGATIVE mg/dL
Leukocytes,Ua: NEGATIVE
Nitrite: NEGATIVE
Protein, ur: NEGATIVE mg/dL
Specific Gravity, Urine: 1.012 (ref 1.005–1.030)
pH: 7 (ref 5.0–8.0)

## 2021-11-18 LAB — TROPONIN I (HIGH SENSITIVITY): Troponin I (High Sensitivity): 29 ng/L — ABNORMAL HIGH (ref <18)

## 2021-11-18 LAB — SARS CORONAVIRUS 2 BY RT PCR: SARS Coronavirus 2 by RT PCR: POSITIVE — AB

## 2021-11-18 LAB — LIPASE, BLOOD: Lipase: 28 U/L (ref 11–51)

## 2021-11-18 LAB — MAGNESIUM: Magnesium: 2.1 mg/dL (ref 1.7–2.4)

## 2021-11-18 MED ORDER — ONDANSETRON 4 MG PO TBDP: 4.0000 mg | ORAL_TABLET | Freq: Three times a day (TID) | ORAL | 0 refills | Status: DC | PRN

## 2021-11-18 MED ORDER — LACTATED RINGERS IV BOLUS
1000.0000 mL | Freq: Once | INTRAVENOUS | Status: AC
  Administered 2021-11-18: 1000 mL via INTRAVENOUS

## 2021-11-18 NOTE — ED Triage Notes (Signed)
Pt was sent here by facility due to having a fever, cough and vomiting.

## 2021-11-18 NOTE — ED Provider Notes (Signed)
[facility] Provider Note    Event Date/Time   First MD Initiated Contact with Patient 11/18/21 2100     (approximate)   History   Cough   HPI  Brooke Pierce is a 88 y.o. female who presents to the ED for evaluation of Cough   I review neurology clinic visit from 7/12.  History of advanced dementia, HTN, stroke  Patient presents from her local SNF for evaluation of cough, fever and emesis.  Patient reports that she feels fine and does not want to be here.  She is requesting transport back.  History limited due to her dementia and disorientation   Physical Exam   Triage Vital Signs: ED Triage Vitals  Enc Vitals Group     BP 11/18/21 2055 (!) 129/51     Pulse Rate 11/18/21 2055 78     Resp 11/18/21 2055 19     Temp 11/18/21 2055 99.1 F (37.3 C)     Temp Source 11/18/21 2055 Oral     SpO2 11/18/21 2053 96 %     Weight 11/18/21 2056 185 lb (83.9 kg)     Height 11/18/21 2056 5\' 5"  (1.651 m)     Head Circumference --      Peak Flow --      Pain Score 11/18/21 2056 0     Pain Loc --      Pain Edu? --      Excl. in GC? --     Most recent vital signs: Vitals:   11/18/21 2053 11/18/21 2055  BP:  (!) 129/51  Pulse:  78  Resp:  19  Temp:  99.1 F (37.3 C)  SpO2: 96% 98%    General: Awake, no distress.  Pleasantly disoriented.  Somewhat cranky CV:  Good peripheral perfusion.  Resp:  Normal effort.  Abd:  No distention.  MSK:  No deformity noted.  Neuro:  No focal deficits appreciated. Other:     ED Results / Procedures / Treatments   Labs (all labs ordered are listed, but only abnormal results are displayed) Labs Reviewed  SARS CORONAVIRUS 2 BY RT PCR - Abnormal; Notable for the following components:      Result Value   SARS Coronavirus 2 by RT PCR POSITIVE (*)    All other components within normal limits  COMPREHENSIVE METABOLIC PANEL - Abnormal; Notable for the following components:   Glucose, Bld 140 (*)    Creatinine, Ser  1.09 (*)    GFR, Estimated 49 (*)    All other components within normal limits  CBC WITH DIFFERENTIAL/PLATELET - Abnormal; Notable for the following components:   Hemoglobin 11.9 (*)    Monocytes Absolute 1.2 (*)    All other components within normal limits  URINALYSIS, ROUTINE W REFLEX MICROSCOPIC - Abnormal; Notable for the following components:   Color, Urine YELLOW (*)    APPearance CLEAR (*)    All other components within normal limits  TROPONIN I (HIGH SENSITIVITY) - Abnormal; Notable for the following components:   Troponin I (High Sensitivity) 29 (*)    All other components within normal limits  MAGNESIUM  LIPASE, BLOOD  TROPONIN I (HIGH SENSITIVITY)    EKG Sinus rhythm with a rate of 77 bpm.  Normal axis and incomplete left bundle.  No STEMI.  RADIOLOGY CT head interpreted by me without evidence of acute intracranial pathology CXR interpreted by me without evidence of acute cardiopulmonary pathology. Plain film of the right knee interpreted  by me without evidence of acute fracture or dislocation.  Official radiology report(s): DG Knee Complete 4 Views Right  Result Date: 11/18/2021 CLINICAL DATA:  Trauma, fall EXAM: RIGHT KNEE - COMPLETE 4+ VIEW COMPARISON:  None Available. FINDINGS: No fracture or dislocation is seen. There is no significant effusion. Degenerative changes are noted with bony spurs and joint space narrowing, more so in the lateral compartment. Scattered vascular calcifications are seen. IMPRESSION: No recent fracture is seen in right knee. Degenerative changes are noted, most severe in the lateral compartment. Electronically Signed   By: Ernie Avena M.D.   On: 11/18/2021 22:49   CT HEAD WO CONTRAST ( )  Result Date: 11/18/2021 CLINICAL DATA:  fall, covid positive.  Fever EXAM: CT HEAD WITHOUT CONTRAST TECHNIQUE: Contiguous axial images were obtained from the base of the skull through the vertex without intravenous contrast. RADIATION DOSE  REDUCTION: This exam was performed according to the departmental dose-optimization program which includes automated exposure control, adjustment of the mA and/or kV according to patient size and/or use of iterative reconstruction technique. COMPARISON:  09/13/2021 FINDINGS: Brain: Normal anatomic configuration. Parenchymal volume loss is commensurate with the patient's age. Moderate periventricular white matter changes are present likely reflecting the sequela of small vessel ischemia. Remote infarct within the left corona radiata, acute on a prior exam MRI examination of 09/13/2021. No abnormal intra or extra-axial mass lesion or fluid collection. No abnormal mass effect or midline shift. No evidence of acute intracranial hemorrhage or infarct. Ventricular size is normal. Cerebellum unremarkable. Vascular: No asymmetric hyperdense vasculature at the skull base. Skull: Intact Sinuses/Orbits: There is extensive mucosal thickening within the ethmoid air cells bilaterally and near complete opacification of the visualized left maxillary sinus. Mild mucosal thickening within the sphenoid sinuses. Orbits are unremarkable. Other: Mastoid air cells and middle ear cavities are clear. IMPRESSION: 1. No acute intracranial abnormality. 2. Stable senescent changes and now remote left corona radiata infarct. 3. Extensive paranasal sinus disease, new since prior examination. Electronically Signed   By: Helyn Numbers M.D.   On: 11/18/2021 22:27   DG Chest Portable 1 View  Result Date: 11/18/2021 CLINICAL DATA:  Fever, cough EXAM: PORTABLE CHEST 1 VIEW COMPARISON:  None Available. FINDINGS: Lungs are well expanded, symmetric, and clear. No pneumothorax or pleural effusion. Cardiac size within normal limits. Pulmonary vascularity is normal. Osseous structures are age-appropriate. No acute bone abnormality. IMPRESSION: No active disease. Electronically Signed   By: Helyn Numbers M.D.   On: 11/18/2021 21:31    PROCEDURES and  INTERVENTIONS:  .1-3 Lead EKG Interpretation  Performed by: Delton Prairie, MD Authorized by: Delton Prairie, MD     Interpretation: normal     ECG rate:  79   ECG rate assessment: normal     Rhythm: sinus rhythm     Ectopy: none     Conduction: normal     Medications  lactated ringers bolus 1,000 mL (1,000 mLs Intravenous New Bag/Given 11/18/21 2203)     IMPRESSION / MDM / ASSESSMENT AND PLAN / ED COURSE  I reviewed the triage vital signs and the nursing notes.  Differential diagnosis includes, but is not limited to, sepsis, pneumonia, COVID, viral syndrome, pancreatitis, dehydration, UTI  {Patient presents with symptoms of an acute illness or injury that is potentially life-threatening.  Pleasantly disoriented 88 year old woman presents from her local SNF with constellation of symptoms likely representing symptomatic COVID-19 and possibly suitable to return to her facility.  Looks systemically well and has reassuring vitals on  room air.  Blood work with no leukocytosis or stigmata of sepsis.  Metabolic panel with mild hyperglycemia without acidosis or signs of DKA.  Normal lipase and urinalysis.  Testing positive for COVID, the likely etiology of her symptoms.  First troponin is slightly elevated, but she is not complaining of any chest pain her EKG is nonischemic.  She is signed out to oncoming provider to follow-up on a repeat troponin with anticipation of outpatient management and return to facility if there is no significant elevation.     FINAL CLINICAL IMPRESSION(S) / ED DIAGNOSES   Final diagnoses:  COVID     Rx / DC Orders   ED Discharge Orders          Ordered    ondansetron (ZOFRAN-ODT) 4 MG disintegrating tablet  Every 8 hours PRN        11/18/21 2315             Note:  This document was prepared using Dragon voice recognition software and may include unintentional dictation errors.   Delton Prairie, MD 11/18/21 [phone]

## 2021-11-18 NOTE — ED Provider Notes (Signed)
Patient signed out to me pending a repeat troponin.  This is an 88 year old female who is presenting with cough and vomiting.  Work-up here overall reassuring she is positive for COVID which explains her symptoms.  First troponin mildly elevated at 29 but patient not having any chest pain.  Pending repeat troponin at the time of signout.  Repeat troponin is flat at 26.   Georga Hacking, MD 11/19/21 [phone]

## 2021-11-18 NOTE — Discharge Instructions (Addendum)
Use Tylenol for pain and fevers.  Up to 1000 mg per dose, up to 4 times per day.  Do not take more than 4000 mg of Tylenol/acetaminophen within 24 hours..  Use the Zofran as needed for any further nausea and vomiting.  Brooke Pierce tested positive for COVID today.  Please treat her symptomatically with Tylenol, keep her isolated and wearing a mask per your institutions protocols.  Return to the ED with any worsening symptoms.

## 2021-11-19 LAB — TROPONIN I (HIGH SENSITIVITY): Troponin I (High Sensitivity): 26 ng/L — ABNORMAL HIGH (ref <18)

## 2022-11-06 ENCOUNTER — Emergency Department
Admission: EM | Admit: 2022-11-06 | Discharge: 2022-11-06 | Disposition: A | Discharge status: Home / Self Care | Payer: Medicare PPO | Attending: Emergency Medicine | Admitting: Emergency Medicine

## 2022-11-06 ENCOUNTER — Other Ambulatory Visit: Payer: Self-pay

## 2022-11-06 ENCOUNTER — Emergency Department: Payer: Medicare PPO

## 2022-11-06 DIAGNOSIS — F0390 Unspecified dementia without behavioral disturbance: Secondary | ICD-10-CM | POA: Diagnosis not present

## 2022-11-06 DIAGNOSIS — I10 Essential (primary) hypertension: Secondary | ICD-10-CM | POA: Diagnosis not present

## 2022-11-06 DIAGNOSIS — I4949 Other premature depolarization: Secondary | ICD-10-CM | POA: Insufficient documentation

## 2022-11-06 DIAGNOSIS — E860 Dehydration: Secondary | ICD-10-CM | POA: Insufficient documentation

## 2022-11-06 DIAGNOSIS — R8290 Unspecified abnormal findings in urine: Secondary | ICD-10-CM | POA: Diagnosis not present

## 2022-11-06 DIAGNOSIS — R42 Dizziness and giddiness: Secondary | ICD-10-CM | POA: Diagnosis present

## 2022-11-06 HISTORY — DX: Restless legs syndrome: G25.81

## 2022-11-06 HISTORY — DX: Unspecified cataract: H26.9

## 2022-11-06 HISTORY — DX: Essential (primary) hypertension: I10

## 2022-11-06 HISTORY — DX: Unspecified sensorineural hearing loss: H90.5

## 2022-11-06 HISTORY — DX: Age-related osteoporosis without current pathological fracture: M81.0

## 2022-11-06 HISTORY — DX: Occlusion and stenosis of unspecified carotid artery: I65.29

## 2022-11-06 LAB — URINALYSIS, ROUTINE W REFLEX MICROSCOPIC
Bilirubin Urine: NEGATIVE
Glucose, UA: NEGATIVE mg/dL
Hgb urine dipstick: NEGATIVE
Ketones, ur: NEGATIVE mg/dL
Leukocytes,Ua: NEGATIVE
Nitrite: NEGATIVE
Protein, ur: 30 mg/dL — AB
Specific Gravity, Urine: 1.018 (ref 1.005–1.030)
pH: 5 (ref 5.0–8.0)

## 2022-11-06 LAB — CBC
HCT: 37.2 % (ref 36.0–46.0)
Hemoglobin: 11.7 g/dL — ABNORMAL LOW (ref 12.0–15.0)
MCH: 28 pg (ref 26.0–34.0)
MCHC: 31.5 g/dL (ref 30.0–36.0)
MCV: 89 fL (ref 80.0–100.0)
Platelets: 244 10*3/uL (ref 150–400)
RBC: 4.18 MIL/uL (ref 3.87–5.11)
RDW: 13.2 % (ref 11.5–15.5)
WBC: 5.7 10*3/uL (ref 4.0–10.5)
nRBC: 0 % (ref 0.0–0.2)

## 2022-11-06 LAB — BASIC METABOLIC PANEL
Anion gap: 7 (ref 5–15)
BUN: 22 mg/dL (ref 8–23)
CO2: 25 mmol/L (ref 22–32)
Calcium: 8.8 mg/dL — ABNORMAL LOW (ref 8.9–10.3)
Chloride: 106 mmol/L (ref 98–111)
Creatinine, Ser: 1.1 mg/dL — ABNORMAL HIGH (ref 0.44–1.00)
GFR, Estimated: 48 mL/min — ABNORMAL LOW (ref >60)
Glucose, Bld: 161 mg/dL — ABNORMAL HIGH (ref 70–99)
Potassium: 3.6 mmol/L (ref 3.5–5.1)
Sodium: 138 mmol/L (ref 135–145)

## 2022-11-06 MED ORDER — SODIUM CHLORIDE 0.9 % IV BOLUS
1000.0000 mL | Freq: Once | INTRAVENOUS | Status: AC
  Administered 2022-11-06: 1000 mL via INTRAVENOUS

## 2022-11-06 NOTE — ED Notes (Signed)
Pt family member POA took pt back to the [facility]. [facility] [place] called and informed of this. No further questions to this RN.

## 2022-11-06 NOTE — ED Provider Notes (Signed)
[facility] Provider Note    Event Date/Time   First MD Initiated Contact with Patient 11/06/22 [phone]     (approximate)   History   Near Syncope   HPI  Brooke Pierce is a 89 y.o. female with a history of dementia and hypertension    Patient was at nursing desk at her care facility when they noted that she might of had a "seizure"  Further description the patient reports that she recalls feeling lightheaded.  EMS reports by the time they arrived the patient was fully awake and alert and was continuing to eat her breakfast tray.  She did not urinate herself or bite her tongue she carries no history of seizure disorder.  She is otherwise been in her normal state of health.  Patient denies being any pain or discomfort.  No fall or injury.   I spoke with Ms. Jonetta Speak, who advises that a plan to check lab tests and a CT scan seems very reasonable.  She is a family member, and attends to her medical care needs.  She will be here at about noon to check on her  Physical Exam   Triage Vital Signs: ED Triage Vitals  Encounter Vitals Group     BP 11/06/22 0924 (!) 152/55     Systolic BP Percentile --      Diastolic BP Percentile --      Pulse Rate 11/06/22 0924 65     Resp 11/06/22 0924 20     Temp 11/06/22 0924 97.8 F (36.6 C)     Temp Source 11/06/22 0924 Oral     SpO2 11/06/22 0924 100 %     Weight 11/06/22 0925 188 lb 11.4 oz (85.6 kg)     Height 11/06/22 0925 5\' 5"  (1.651 m)     Head Circumference --      Peak Flow --      Pain Score 11/06/22 0925 0     Pain Loc --      Pain Education --      Exclude from Growth Chart --     Most recent vital signs: Vitals:   11/06/22 0924 11/06/22 1045  BP: (!) 152/55   Pulse: 65 70  Resp: 20 18  Temp: 97.8 F (36.6 C)   SpO2: 100% 95%     General: Awake, no distress.  Oriented to herself, but reports she cannot tell us where she currently lives.  No psychomotor agitation.  She is pleasantly  confused but answers questions speaks clearly CV:  Good peripheral perfusion.  Normal tones and rate Resp:  Normal effort.  Normal tones and rate Abd:  No distention.  Soft nontender nondistended Other:  Equal facial expressions clear speech.  Moves all extremities.  No seizure-like activity or tremulousness   ED Results / Procedures / Treatments   Labs (all labs ordered are listed, but only abnormal results are displayed) Labs Reviewed  CBC - Abnormal; Notable for the following components:      Result Value   Hemoglobin 11.7 (*)    All other components within normal limits  BASIC METABOLIC PANEL - Abnormal; Notable for the following components:   Glucose, Bld 161 (*)    Creatinine, Ser 1.10 (*)    Calcium 8.8 (*)    GFR, Estimated 48 (*)    All other components within normal limits  URINALYSIS, ROUTINE W REFLEX MICROSCOPIC - Abnormal; Notable for the following components:   Color, Urine YELLOW (*)  APPearance HAZY (*)    Protein, ur 30 (*)    Bacteria, UA RARE (*)    All other components within normal limits     EKG  And interpreted by me at 930 heart rate 60 QRS 120 QTc 470 First-degree AV block no evidence of acute ischemia.  Occasional PAC   RADIOLOGY  CT Head Wo Contrast  Result Date: 11/06/2022 CLINICAL DATA:  Seizure, new-onset, no history of trauma near syncope vs seizure. EXAM: CT HEAD WITHOUT CONTRAST TECHNIQUE: Contiguous axial images were obtained from the base of the skull through the vertex without intravenous contrast. RADIATION DOSE REDUCTION: This exam was performed according to the departmental dose-optimization program which includes automated exposure control, adjustment of the mA and/or kV according to patient size and/or use of iterative reconstruction technique. COMPARISON:  None Available. FINDINGS: Brain: No acute hemorrhage. Confluent hypoattenuation of the cerebral white matter, most consistent with severe chronic small-vessel disease. No  hydrocephalus or extra-axial collection. No mass effect or midline shift. Vascular: No hyperdense vessel or unexpected calcification. Skull: No calvarial fracture or suspicious bone lesion. Skull base is unremarkable. Sinuses/Orbits: No acute finding. Other: None. IMPRESSION: 1. No acute intracranial abnormality. 2. Severe chronic small-vessel disease. Electronically Signed   By: Orvan Falconer M.D.   On: 11/06/2022 11:03      PROCEDURES:  Critical Care performed: No  Procedures   MEDICATIONS ORDERED IN ED: Medications  sodium chloride 0.9 % bolus 1,000 mL (1,000 mLs Intravenous New Bag/Given 11/06/22 1122)     IMPRESSION / MDM / ASSESSMENT AND PLAN / ED COURSE  I reviewed the triage vital signs and the nursing notes.                              Differential diagnosis includes, but is not limited to, electrolyte abnormality, dehydration, dysrhythmia, AKI, seizure, or less likely central neurologic process stroke etc.  Very reassuring cardiac vascular pulmonary and neurologic examination at this time.  She is alert oriented to self, history of dementia.  She is in no distress with no acute complaint  On laboratory evaluation it appears she may have very mild dehydration, no old for comparison.  Plan to hydrate.  Her history of seizures seems questionable she did not pass out completely, she did not bite her tongue did not urinate herself and by the time EMS arrived she was alert and eating her breakfast meal again.  Patient relates she felt a brief episode of feeling lightheaded.  Patient's presentation is most consistent with acute complicated illness / injury requiring diagnostic workup.   The patient is on the cardiac monitor to evaluate for evidence of arrhythmia and/or significant heart rate changes.   Clinical Course as of 11/06/22 1131  Wed Nov 06, 2022  1128 Discussed and reviewed the patient's ECG with Dr. Joylene Igo or of cardiology.  At first glance I had thought that the  potentially heart block could be present, but after discussing the case with him and he reviewed, advises no block other than first-degree.  He advises ectopic P wave noted. No unstable rythmn [MQ]    Clinical Course User Index [MQ] Sharyn Creamer, MD   Plan to hydrate, and thereafter likely discharge back to home.  Her healthcare power of attorney expects to be to the hospital around noon  ----------------------------------------- 12:49 PM on 11/06/2022 ----------------------------------------- Patient healthcare power of attorney at bedside, feels patient is doing well comfortable discharging and will  take her back to her care facility.  Updated on results of CT of the abdomen concern for mild dehydration.  Will discontinue IV fluids after about 300 mL normal saline as patient reports need to urinate, and guardian feels comfortable with plan to take her home.  She also relates that she does have a history of lower extremity edema, and wishes to avoid giving too much fluid.  At this juncture I think that is reasonable.  Patient is awake alert, to her baseline in no distress  Return precautions and treatment recommendations and follow-up discussed with the patient who is agreeable with the plan.   FINAL CLINICAL IMPRESSION(S) / ED DIAGNOSES   Final diagnoses:  Mild dehydration  Ectopic atrial beats     Rx / DC Orders   ED Discharge Orders     None        Note:  This document was prepared using Dragon voice recognition software and may include unintentional dictation errors.   Sharyn Creamer, MD 11/06/22 1250

## 2022-11-06 NOTE — ED Notes (Signed)
Pt ambulatory with x1 assistance to bathroom and back into bed

## 2022-11-06 NOTE — ED Triage Notes (Signed)
Pt arrives via EMS from [facility] w/ c/o near syncope or seizure. Per EMS, staff noted pt to be convulsing in chair. On EMS arrival pt was eating breakfast and at her baseline. Pt does not know what happened. Pt has hx dementia, oriented to self, situation, hospital. No hx seizure.   157/66 76 sinus 96% ra  EKG unremark Cbg 159

## 2023-02-23 ENCOUNTER — Emergency Department: Payer: Medicare PPO

## 2023-02-23 ENCOUNTER — Emergency Department
Admission: EM | Admit: 2023-02-23 | Discharge: 2023-02-23 | Disposition: A | Discharge status: Home / Self Care | Payer: Medicare PPO | Attending: Student in an Organized Health Care Education/Training Program | Admitting: Student in an Organized Health Care Education/Training Program

## 2023-02-23 ENCOUNTER — Other Ambulatory Visit: Payer: Self-pay

## 2023-02-23 DIAGNOSIS — I4891 Unspecified atrial fibrillation: Secondary | ICD-10-CM | POA: Diagnosis not present

## 2023-02-23 DIAGNOSIS — R059 Cough, unspecified: Secondary | ICD-10-CM | POA: Diagnosis present

## 2023-02-23 LAB — COMPREHENSIVE METABOLIC PANEL
ALT: 18 U/L (ref 0–44)
AST: 20 U/L (ref 15–41)
Albumin: 4 g/dL (ref 3.5–5.0)
Alkaline Phosphatase: 57 U/L (ref 38–126)
Anion gap: 8 (ref 5–15)
BUN: 19 mg/dL (ref 8–23)
CO2: 23 mmol/L (ref 22–32)
Calcium: 8.7 mg/dL — ABNORMAL LOW (ref 8.9–10.3)
Chloride: 102 mmol/L (ref 98–111)
Creatinine, Ser: 1 mg/dL (ref 0.44–1.00)
GFR, Estimated: 54 mL/min — ABNORMAL LOW (ref >60)
Glucose, Bld: 153 mg/dL — ABNORMAL HIGH (ref 70–99)
Potassium: 3.5 mmol/L (ref 3.5–5.1)
Sodium: 133 mmol/L — ABNORMAL LOW (ref 135–145)
Total Bilirubin: 0.8 mg/dL (ref <1.2)
Total Protein: 7.2 g/dL (ref 6.5–8.1)

## 2023-02-23 LAB — CBC WITH DIFFERENTIAL/PLATELET
Abs Immature Granulocytes: 0.02 10*3/uL (ref 0.00–0.07)
Basophils Absolute: 0 10*3/uL (ref 0.0–0.1)
Basophils Relative: 1 %
Eosinophils Absolute: 0.1 10*3/uL (ref 0.0–0.5)
Eosinophils Relative: 2 %
HCT: 39.7 % (ref 36.0–46.0)
Hemoglobin: 12.6 g/dL (ref 12.0–15.0)
Immature Granulocytes: 0 %
Lymphocytes Relative: 28 %
Lymphs Abs: 1.9 10*3/uL (ref 0.7–4.0)
MCH: 27.9 pg (ref 26.0–34.0)
MCHC: 31.7 g/dL (ref 30.0–36.0)
MCV: 87.8 fL (ref 80.0–100.0)
Monocytes Absolute: 0.6 10*3/uL (ref 0.1–1.0)
Monocytes Relative: 10 %
Neutro Abs: 4 10*3/uL (ref 1.7–7.7)
Neutrophils Relative %: 59 %
Platelets: 264 10*3/uL (ref 150–400)
RBC: 4.52 MIL/uL (ref 3.87–5.11)
RDW: 13.2 % (ref 11.5–15.5)
WBC: 6.6 10*3/uL (ref 4.0–10.5)
nRBC: 0 % (ref 0.0–0.2)

## 2023-02-23 MED ORDER — METOPROLOL TARTRATE 5 MG/5ML IV SOLN
5.0000 mg | Freq: Once | INTRAVENOUS | Status: AC
  Administered 2023-02-23: 5 mg via INTRAVENOUS
  Filled 2023-02-23: qty 5

## 2023-02-23 MED ORDER — METOPROLOL TARTRATE 25 MG PO TABS
25.0000 mg | ORAL_TABLET | Freq: Once | ORAL | Status: AC
  Administered 2023-02-23: 25 mg via ORAL
  Filled 2023-02-23: qty 1

## 2023-02-23 MED ORDER — METOPROLOL TARTRATE 25 MG PO TABS: 25.0000 mg | ORAL_TABLET | Freq: Two times a day (BID) | ORAL | 11 refills | Status: DC

## 2023-02-23 MED ORDER — SODIUM CHLORIDE 0.9 % IV BOLUS
500.0000 mL | Freq: Once | INTRAVENOUS | Status: AC
  Administered 2023-02-23: 500 mL via INTRAVENOUS

## 2023-02-23 MED ORDER — APIXABAN 5 MG PO TABS: 5.0000 mg | ORAL_TABLET | Freq: Two times a day (BID) | ORAL | 0 refills | Status: DC

## 2023-02-23 NOTE — ED Triage Notes (Signed)
Pt in via EMS from [facility] [address] Pt with cough, haws chronic cough but this am felt like she choked while she coughed and it scared her. Pt with hx of dementia. HR 120's. 156/89, 95% RA

## 2023-02-23 NOTE — ED Notes (Signed)
Helped pt walk to toilet to void.

## 2023-02-23 NOTE — ED Provider Notes (Addendum)
[facility] Provider Note    Event Date/Time   First MD Initiated Contact with Patient 02/23/23 1312     (approximate)   History   Cough   HPI  Brooke Pierce is a 89 y.o. female presents the ER for evaluation of coughing spell that occurred this morning after eating breakfast too fast.  She is accompanied by her healthcare power of attorney states that the patient has this occur quite frequently but they had noticed some increasing exertional dyspnea over the past few weeks.  Not abrupt in etiology.  Patient does have advanced dementia and has not been able to provide much additional history.     Physical Exam   Triage Vital Signs: ED Triage Vitals  Encounter Vitals Group     BP 02/23/23 1022 (!) 140/79     Systolic BP Percentile --      Diastolic BP Percentile --      Pulse Rate 02/23/23 1022 (!) 120     Resp 02/23/23 1022 20     Temp 02/23/23 1022 (!) 97.5 F (36.4 C)     Temp Source 02/23/23 1022 Oral     SpO2 02/23/23 1022 95 %     Weight 02/23/23 1017 180 lb (81.6 kg)     Height 02/23/23 1017 5\' 5"  (1.651 m)     Head Circumference --      Peak Flow --      Pain Score 02/23/23 1017 0     Pain Loc --      Pain Education --      Exclude from Growth Chart --     Most recent vital signs: Vitals:   02/23/23 1340 02/23/23 1406  BP:  134/79  Pulse: (!) 106 96  Resp: 16   Temp:    SpO2: 98%      Constitutional: Alert  Eyes: Conjunctivae are normal.  Head: Atraumatic. Nose: No congestion/rhinnorhea. Mouth/Throat: Mucous membranes are moist.   Neck: Painless ROM.  Cardiovascular:   Good peripheral circulation.  Tachycardic and irregularly irregular. Respiratory: Normal respiratory effort.  No retractions.  Gastrointestinal: Soft and nontender.  Musculoskeletal:  no deformity Neurologic:  MAE spontaneously. No gross focal neurologic deficits are appreciated.  Skin:  Skin is warm, dry and intact. No rash noted. Psychiatric: Mood  and affect are normal. Speech and behavior are normal.    ED Results / Procedures / Treatments   Labs (all labs ordered are listed, but only abnormal results are displayed) Labs Reviewed  COMPREHENSIVE METABOLIC PANEL - Abnormal; Notable for the following components:      Result Value   Sodium 133 (*)    Glucose, Bld 153 (*)    Calcium 8.7 (*)    GFR, Estimated 54 (*)    All other components within normal limits  CBC WITH DIFFERENTIAL/PLATELET     EKG  ED ECG REPORT I, Willy Eddy, the attending physician, personally viewed and interpreted this ECG.   Date: 02/23/2023  EKG Time: 13:29  Rate: 110  Rhythm: afib  Axis: normal  Intervals: normal  ST&T Change: no stemi, non specific st abn    RADIOLOGY Please see ED Course for my review and interpretation.  I personally reviewed all radiographic images ordered to evaluate for the above acute complaints and reviewed radiology reports and findings.  These findings were personally discussed with the patient.  Please see medical record for radiology report.    PROCEDURES:  Critical Care performed:no   Procedures  MEDICATIONS ORDERED IN ED: Medications  metoprolol tartrate (LOPRESSOR) injection 5 mg (5 mg Intravenous Given 02/23/23 1342)  sodium chloride 0.9 % bolus 500 mL (500 mLs Intravenous New Bag/Given 02/23/23 1342)  metoprolol tartrate (LOPRESSOR) tablet 25 mg (25 mg Oral Given 02/23/23 1406)     IMPRESSION / MDM / ASSESSMENT AND PLAN / ED COURSE  I reviewed the triage vital signs and the nursing notes.                              Differential diagnosis includes, but is not limited to, dysrhythmia, electrolyte abnormality, CHF, pneumonia, aspiration,  Patient presenting to the ER for evaluation of symptoms as described above.  Based on symptoms, risk factors and considered above differential, this presenting complaint could reflect a potentially life-threatening illness therefore the patient will  be placed on continuous pulse oximetry and telemetry for monitoring.  Laboratory evaluation will be sent to evaluate for the above complaints.  Patient clinically well-appearing in no acute distress.  No respiratory symptoms at this time.  Lungs are clear.  Found to be in A-fib with RVR but well-perfused.  This is a new diagnosis.  Given some IV fluids but still in persistent A-fib.  Will trial Lopressor.   Clinical Course as of 02/23/23 1441  Sun Feb 23, 2023  1421 Heart rates have significantly improved after Lopressor will give oral metoprolol. [PR]  1432 Patient reassessed.  Discussed case in consultation with cardiology.  Given her age and risk factors has recommended anticoagulation with Eliquis, hold aspirin continue Plavix and see in clinic early this week.  We discussed option for admission to the hospital with patient but given the patient's dementia power of attorney is motivated to try to avoid hospitalization unless completely necessary and given her otherwise reassuring workup I think that it is reasonable for outpatient follow-up. [PR]    Clinical Course User Index [PR] Willy Eddy, MD     FINAL CLINICAL IMPRESSION(S) / ED DIAGNOSES   Final diagnoses:  Atrial fibrillation with rapid ventricular response (HCC)     Rx / DC Orders   ED Discharge Orders          Ordered    metoprolol tartrate (LOPRESSOR) 25 MG tablet  2 times daily        02/23/23 1438    apixaban (ELIQUIS) 5 MG TABS tablet  2 times daily        02/23/23 1438             Note:  This document was prepared using Dragon voice recognition software and may include unintentional dictation errors.    Willy Eddy, MD 02/23/23 1441    Willy Eddy, MD 02/23/23 [phone]

## 2023-02-23 NOTE — Discharge Instructions (Signed)
Please stop taking your aspirin.  Continue Plavix.  Start taking Eliquis 5 mg twice daily.  You can take your first Eliquis dose tonight.  I have sent a prescription for metoprolol a beta-blocker medication which helps control your heart rate.  You can start taking this tomorrow morning (11/15).  Call [facility] clinic cardiology for follow-up appointment.

## 2023-02-25 ENCOUNTER — Other Ambulatory Visit
Admission: RE | Admit: 2023-02-25 | Discharge: 2023-02-25 | Disposition: A | Discharge status: Home / Self Care | Payer: Medicare PPO | Attending: Nurse Practitioner | Admitting: Nurse Practitioner

## 2023-02-25 DIAGNOSIS — R0602 Shortness of breath: Secondary | ICD-10-CM | POA: Diagnosis present

## 2023-02-25 LAB — BRAIN NATRIURETIC PEPTIDE: B Natriuretic Peptide: 290.2 pg/mL — ABNORMAL HIGH (ref 0.0–100.0)

## 2023-02-25 LAB — D-DIMER, QUANTITATIVE: D-Dimer, Quant: 0.41 ug{FEU}/mL (ref 0.00–0.50)

## 2023-03-26 ENCOUNTER — Emergency Department: Payer: Medicare PPO

## 2023-03-26 ENCOUNTER — Observation Stay
Admission: EM | Admit: 2023-03-26 | Discharge: 2023-03-27 | Disposition: A | Discharge status: Home / Self Care | Payer: Medicare PPO | Attending: Internal Medicine | Admitting: Internal Medicine

## 2023-03-26 ENCOUNTER — Other Ambulatory Visit: Payer: Self-pay

## 2023-03-26 DIAGNOSIS — R6511 Systemic inflammatory response syndrome (SIRS) of non-infectious origin with acute organ dysfunction: Secondary | ICD-10-CM | POA: Diagnosis not present

## 2023-03-26 DIAGNOSIS — Z7902 Long term (current) use of antithrombotics/antiplatelets: Secondary | ICD-10-CM | POA: Insufficient documentation

## 2023-03-26 DIAGNOSIS — R2689 Other abnormalities of gait and mobility: Secondary | ICD-10-CM | POA: Diagnosis not present

## 2023-03-26 DIAGNOSIS — M6281 Muscle weakness (generalized): Secondary | ICD-10-CM | POA: Diagnosis not present

## 2023-03-26 DIAGNOSIS — K5730 Diverticulosis of large intestine without perforation or abscess without bleeding: Secondary | ICD-10-CM | POA: Diagnosis not present

## 2023-03-26 DIAGNOSIS — Z7982 Long term (current) use of aspirin: Secondary | ICD-10-CM | POA: Insufficient documentation

## 2023-03-26 DIAGNOSIS — I10 Essential (primary) hypertension: Secondary | ICD-10-CM | POA: Insufficient documentation

## 2023-03-26 DIAGNOSIS — I480 Paroxysmal atrial fibrillation: Secondary | ICD-10-CM | POA: Insufficient documentation

## 2023-03-26 DIAGNOSIS — E876 Hypokalemia: Secondary | ICD-10-CM | POA: Insufficient documentation

## 2023-03-26 DIAGNOSIS — Z7901 Long term (current) use of anticoagulants: Secondary | ICD-10-CM | POA: Diagnosis not present

## 2023-03-26 DIAGNOSIS — K529 Noninfective gastroenteritis and colitis, unspecified: Secondary | ICD-10-CM | POA: Diagnosis not present

## 2023-03-26 DIAGNOSIS — F0150 Vascular dementia without behavioral disturbance: Secondary | ICD-10-CM | POA: Insufficient documentation

## 2023-03-26 DIAGNOSIS — I4891 Unspecified atrial fibrillation: Secondary | ICD-10-CM

## 2023-03-26 DIAGNOSIS — E872 Acidosis, unspecified: Secondary | ICD-10-CM | POA: Diagnosis not present

## 2023-03-26 DIAGNOSIS — Z8673 Personal history of transient ischemic attack (TIA), and cerebral infarction without residual deficits: Secondary | ICD-10-CM | POA: Diagnosis not present

## 2023-03-26 DIAGNOSIS — N179 Acute kidney failure, unspecified: Secondary | ICD-10-CM | POA: Diagnosis not present

## 2023-03-26 DIAGNOSIS — R112 Nausea with vomiting, unspecified: Secondary | ICD-10-CM | POA: Diagnosis present

## 2023-03-26 DIAGNOSIS — R6510 Systemic inflammatory response syndrome (SIRS) of non-infectious origin without acute organ dysfunction: Principal | ICD-10-CM

## 2023-03-26 DIAGNOSIS — Z1152 Encounter for screening for COVID-19: Secondary | ICD-10-CM | POA: Insufficient documentation

## 2023-03-26 LAB — URINALYSIS, ROUTINE W REFLEX MICROSCOPIC
Bacteria, UA: NONE SEEN
Bilirubin Urine: NEGATIVE
Glucose, UA: NEGATIVE mg/dL
Hgb urine dipstick: NEGATIVE
Ketones, ur: NEGATIVE mg/dL
Leukocytes,Ua: NEGATIVE
Nitrite: NEGATIVE
Protein, ur: 30 mg/dL — AB
Specific Gravity, Urine: 1.026 (ref 1.005–1.030)
pH: 5 (ref 5.0–8.0)

## 2023-03-26 LAB — COMPREHENSIVE METABOLIC PANEL
ALT: 19 U/L (ref 0–44)
AST: 23 U/L (ref 15–41)
Albumin: 4.1 g/dL (ref 3.5–5.0)
Alkaline Phosphatase: 53 U/L (ref 38–126)
Anion gap: 17 — ABNORMAL HIGH (ref 5–15)
BUN: 38 mg/dL — ABNORMAL HIGH (ref 8–23)
CO2: 19 mmol/L — ABNORMAL LOW (ref 22–32)
Calcium: 9 mg/dL (ref 8.9–10.3)
Chloride: 103 mmol/L (ref 98–111)
Creatinine, Ser: 1.6 mg/dL — ABNORMAL HIGH (ref 0.44–1.00)
GFR, Estimated: 31 mL/min — ABNORMAL LOW (ref >60)
Glucose, Bld: 177 mg/dL — ABNORMAL HIGH (ref 70–99)
Potassium: 3.9 mmol/L (ref 3.5–5.1)
Sodium: 139 mmol/L (ref 135–145)
Total Bilirubin: 1 mg/dL (ref <1.2)
Total Protein: 7.1 g/dL (ref 6.5–8.1)

## 2023-03-26 LAB — LACTIC ACID, PLASMA: Lactic Acid, Venous: 3.4 mmol/L (ref 0.5–1.9)

## 2023-03-26 LAB — LIPASE, BLOOD: Lipase: 26 U/L (ref 11–51)

## 2023-03-26 LAB — CBC
HCT: 43.7 % (ref 36.0–46.0)
Hemoglobin: 14.2 g/dL (ref 12.0–15.0)
MCH: 28.1 pg (ref 26.0–34.0)
MCHC: 32.5 g/dL (ref 30.0–36.0)
MCV: 86.4 fL (ref 80.0–100.0)
Platelets: 241 10*3/uL (ref 150–400)
RBC: 5.06 MIL/uL (ref 3.87–5.11)
RDW: 13.2 % (ref 11.5–15.5)
WBC: 14.6 10*3/uL — ABNORMAL HIGH (ref 4.0–10.5)
nRBC: 0 % (ref 0.0–0.2)

## 2023-03-26 LAB — D-DIMER, QUANTITATIVE: D-Dimer, Quant: 0.51 ug{FEU}/mL — ABNORMAL HIGH (ref 0.00–0.50)

## 2023-03-26 LAB — TROPONIN I (HIGH SENSITIVITY): Troponin I (High Sensitivity): 12 ng/L (ref <18)

## 2023-03-26 LAB — RESP PANEL BY RT-PCR (RSV, FLU A&B, COVID)  RVPGX2
Influenza A by PCR: NEGATIVE
Influenza B by PCR: NEGATIVE
Resp Syncytial Virus by PCR: NEGATIVE
SARS Coronavirus 2 by RT PCR: NEGATIVE

## 2023-03-26 LAB — MAGNESIUM: Magnesium: 2.2 mg/dL (ref 1.7–2.4)

## 2023-03-26 MED ORDER — SODIUM CHLORIDE 0.9 % IV SOLN: INTRAVENOUS | Status: DC

## 2023-03-26 MED ORDER — ONDANSETRON HCL 4 MG/2ML IJ SOLN: 4.0000 mg | Freq: Four times a day (QID) | INTRAMUSCULAR | Status: DC | PRN

## 2023-03-26 MED ORDER — METOPROLOL TARTRATE 50 MG PO TABS: 50.0000 mg | ORAL_TABLET | Freq: Two times a day (BID) | ORAL | Status: DC

## 2023-03-26 MED ORDER — ASPIRIN 81 MG PO TBEC
81.0000 mg | DELAYED_RELEASE_TABLET | Freq: Every day | ORAL | Status: DC
  Administered 2023-03-26 – 2023-03-27 (×2): 81 mg via ORAL
  Filled 2023-03-26 (×2): qty 1

## 2023-03-26 MED ORDER — CLOPIDOGREL BISULFATE 75 MG PO TABS: 75.0000 mg | ORAL_TABLET | Freq: Every day | ORAL | Status: DC

## 2023-03-26 MED ORDER — DILTIAZEM HCL 25 MG/5ML IV SOLN
10.0000 mg | Freq: Once | INTRAVENOUS | Status: AC
  Administered 2023-03-26: 10 mg via INTRAVENOUS
  Filled 2023-03-26: qty 5

## 2023-03-26 MED ORDER — QUETIAPINE FUMARATE 25 MG PO TABS
25.0000 mg | ORAL_TABLET | Freq: Every day | ORAL | Status: DC | PRN
Start: 2023-03-26 — End: 2023-03-27
  Administered 2023-03-27: 25 mg via ORAL
  Filled 2023-03-26: qty 1

## 2023-03-26 MED ORDER — POLYVINYL ALCOHOL 1.4 % OP SOLN
1.0000 [drp] | Freq: Four times a day (QID) | OPHTHALMIC | Status: DC
  Administered 2023-03-26 – 2023-03-27 (×4): 1 [drp] via OPHTHALMIC
  Filled 2023-03-26 (×2): qty 15

## 2023-03-26 MED ORDER — MIRTAZAPINE 15 MG PO TABS
15.0000 mg | ORAL_TABLET | Freq: Every day | ORAL | Status: DC
  Administered 2023-03-26: 15 mg via ORAL
  Filled 2023-03-26: qty 1

## 2023-03-26 MED ORDER — METOPROLOL TARTRATE 5 MG/5ML IV SOLN: 5.0000 mg | INTRAVENOUS | Status: DC | PRN

## 2023-03-26 MED ORDER — QUETIAPINE FUMARATE 25 MG PO TABS: 50.0000 mg | ORAL_TABLET | Freq: Every day | ORAL | Status: DC

## 2023-03-26 MED ORDER — ACETAMINOPHEN 325 MG PO TABS
650.0000 mg | ORAL_TABLET | ORAL | Status: DC | PRN
  Administered 2023-03-26: 650 mg via ORAL
  Filled 2023-03-26: qty 2

## 2023-03-26 MED ORDER — APIXABAN 5 MG PO TABS
5.0000 mg | ORAL_TABLET | Freq: Two times a day (BID) | ORAL | Status: DC
  Administered 2023-03-26 – 2023-03-27 (×3): 5 mg via ORAL
  Filled 2023-03-26 (×3): qty 1

## 2023-03-26 MED ORDER — SODIUM CHLORIDE 0.9 % IV BOLUS
500.0000 mL | Freq: Once | INTRAVENOUS | Status: AC
Start: 2023-03-26 — End: 2023-03-26
  Administered 2023-03-26: 500 mL via INTRAVENOUS

## 2023-03-26 MED ORDER — FLORANEX PO PACK
1.0000 g | PACK | Freq: Three times a day (TID) | ORAL | Status: DC
  Administered 2023-03-26 – 2023-03-27 (×2): 1 g via ORAL
  Filled 2023-03-26 (×3): qty 1

## 2023-03-26 MED ORDER — ATORVASTATIN CALCIUM 20 MG PO TABS
40.0000 mg | ORAL_TABLET | Freq: Every day | ORAL | Status: DC
  Administered 2023-03-26 – 2023-03-27 (×2): 40 mg via ORAL
  Filled 2023-03-26 (×2): qty 2

## 2023-03-26 MED ORDER — TIMOLOL MALEATE 0.5 % OP SOLN
1.0000 [drp] | Freq: Two times a day (BID) | OPHTHALMIC | Status: DC
  Administered 2023-03-26 – 2023-03-27 (×2): 1 [drp] via OPHTHALMIC
  Filled 2023-03-26: qty 5

## 2023-03-26 MED ORDER — DULOXETINE HCL 30 MG PO CPEP
30.0000 mg | ORAL_CAPSULE | Freq: Every day | ORAL | Status: DC
  Administered 2023-03-26 – 2023-03-27 (×2): 30 mg via ORAL
  Filled 2023-03-26 (×2): qty 1

## 2023-03-26 MED ORDER — ONDANSETRON HCL 4 MG/2ML IJ SOLN
4.0000 mg | INTRAMUSCULAR | Status: AC
  Administered 2023-03-26: 4 mg via INTRAVENOUS
  Filled 2023-03-26: qty 2

## 2023-03-26 MED ORDER — METOPROLOL TARTRATE 50 MG PO TABS
50.0000 mg | ORAL_TABLET | Freq: Two times a day (BID) | ORAL | Status: DC
  Administered 2023-03-26 – 2023-03-27 (×3): 50 mg via ORAL
  Filled 2023-03-26: qty 2
  Filled 2023-03-26 (×2): qty 1

## 2023-03-26 MED ORDER — FAMOTIDINE 20 MG PO TABS
20.0000 mg | ORAL_TABLET | Freq: Every day | ORAL | Status: DC
  Administered 2023-03-26 – 2023-03-27 (×2): 20 mg via ORAL
  Filled 2023-03-26 (×2): qty 1

## 2023-03-26 MED ORDER — LATANOPROST 0.005 % OP SOLN: 1.0000 [drp] | Freq: Every day | OPHTHALMIC | Status: DC

## 2023-03-26 MED ORDER — DULOXETINE HCL 30 MG PO CPEP
60.0000 mg | ORAL_CAPSULE | Freq: Every day | ORAL | Status: DC
  Administered 2023-03-26 – 2023-03-27 (×2): 60 mg via ORAL
  Filled 2023-03-26: qty 2
  Filled 2023-03-26: qty 1

## 2023-03-26 MED ORDER — DORZOLAMIDE HCL-TIMOLOL MAL 2-0.5 % OP SOLN
1.0000 [drp] | Freq: Two times a day (BID) | OPHTHALMIC | Status: DC
Start: 2023-03-26 — End: 2023-03-26
  Filled 2023-03-26: qty 10

## 2023-03-26 MED ORDER — DORZOLAMIDE HCL 2 % OP SOLN
1.0000 [drp] | Freq: Two times a day (BID) | OPHTHALMIC | Status: DC
  Administered 2023-03-26 – 2023-03-27 (×2): 1 [drp] via OPHTHALMIC
  Filled 2023-03-26: qty 10

## 2023-03-26 NOTE — ED Notes (Signed)
Family at bedside. Patient refused lunch except for Apple Pie.

## 2023-03-26 NOTE — ED Notes (Signed)
Pt repositioned in bed. Brief clean and dry. No further

## 2023-03-26 NOTE — H&P (Addendum)
History and Physical    SA[place] QMV:[id number] DOB: 07-11-1931 DOA: 03/26/2023  PCP: Bryson Corona, NP (Confirm with patient/family/NH records and if not entered, this has to be entered at [facility] point of entry) Patient coming from: SNF  I have personally briefly reviewed patient's old medical records in [facility]  Chief Complaint: Nauseous vomiting diarrhea  HPI: Brooke Pierce is a 89 y.o. female with medical history significant of recently diagnosed PAF on Eliquis, vascular dementia, sundowning syndrome, HTN, HLD, multiple strokes in the past, sent from nursing home for evaluation of new onset of nauseous vomiting diarrhea.  Patient is demented unable to provide any history, or history obtained by reviewing ED and nursing home record and talking to ED staff.  Patient started to have a nauseous vomiting and multiple loose diarrhea last night.  Patient denies any abdominal pain and she does not remember was having nausea vomiting and diarrhea.  Denies any cough, no urinary symptoms. ED Course: Afebrile with temperature 97.8, heart rate 150s in rapid A-fib blood pressure 140/90, O2 saturation 97% on room air.  CT abdomen pelvis showed signs of acute enteritis.  Blood work showed WBC 14.6, hemoglobin 14.2, creatinine 1.6 compared to baseline 1.0, BUN 38, bicarb 19, lactic acid 3.4, anion gap 17.  Was given IV bolus 500 x 1 in the ED.  Zofran x 1 given, and patient was started on Cardizem drip for rapid A-fib.  Review of Systems: Unable to perform, patient is demented at baseline  Past Medical History:  Diagnosis Date   Anxiety    Carotid atherosclerosis    Cataract    Depression    Glaucoma    HTN (hypertension)    Hypertension    Insomnia    Osteoporosis    Restless leg syndrome    Sensorineural hearing loss    Stroke [facility])    Vascular dementia (HCC)     History reviewed. No pertinent surgical history.   reports that she has never smoked. She has never used  smokeless tobacco. No history on file for alcohol use and drug use.  Allergies  Allergen Reactions   Calcium-Containing Compounds Nausea And Vomiting   Calcium-Containing Compounds    Coreg [Carvedilol] Itching   Coreg [Carvedilol]    Iodine     Family History  Problem Relation Age of Onset   Dementia Brother      Prior to Admission medications   Medication Sig Start Date End Date Taking? Authorizing Provider  acetaminophen (TYLENOL) 325 MG tablet Take 650 mg by mouth every 4 (four) hours as needed for mild pain or fever.    [provider]  aluminum-magnesium hydroxide-simethicone (MAALOX) 200-200-20 MG/5ML SUSP Take 30 mLs by mouth 4 (four) times daily as needed (heartburn or indigestion).    [provider]  amLODipine (NORVASC) 10 MG tablet Take 10 mg by mouth daily. 08/22/21   [provider]  amLODipine (NORVASC) 10 MG tablet Take 10 mg by mouth daily.    [provider]  apixaban (ELIQUIS) 5 MG TABS tablet Take 1 tablet (5 mg total) by mouth 2 (two) times daily. 02/23/23 03/25/23  Willy Eddy, MD  aspirin EC 81 MG tablet Take 81 mg by mouth daily.    [provider]  atorvastatin (LIPITOR) 40 MG tablet Take 1 tablet (40 mg total) by mouth daily. 09/14/21 10/14/21  Marrion Coy, MD  atorvastatin (LIPITOR) 80 MG tablet Take 80 mg by mouth at bedtime.    [provider]  benzonatate (TESSALON) 100 MG capsule Take 100 mg by mouth 3 (three) times daily as needed for cough.    [provider]  Carboxymethylcellulose Sod PF (REFRESH PLUS) 0.5 % SOLN Place 1 drop into both eyes 4 (four) times daily.    [provider]  Cholecalciferol (VITAMIN D) 50 MCG (2000 UT) tablet Take 2,000 Units by mouth daily.    [provider]  clopidogrel (PLAVIX) 75 MG tablet Take 75 mg by mouth daily. 08/22/21   [provider]  D3 50 MCG (2000 UT) TABS Take 1 tablet by mouth daily. 08/22/21   [provider]  dorzolamide-timolol (COSOPT) 2-0.5 % ophthalmic solution Place 1 drop into both eyes 2 (two) times daily.    [provider]  DULoxetine (CYMBALTA) 30 MG capsule Take 30 mg by mouth daily. Take along with one 60 mg capsule for total 90 mg once daily 08/22/21   [provider]  DULoxetine (CYMBALTA) 30 MG capsule Take 30 mg by mouth daily. (Take with 60mg  capsule to equal 90mg  daily)    [provider]  DULoxetine (CYMBALTA) 60 MG capsule Take 60 mg by mouth daily. Take along with one 30 mg capsule for total 90 mg once daily 08/22/21   [provider]  DULoxetine (CYMBALTA) 60 MG capsule Take 60 mg by mouth daily. (Take with 30mg  capsule to equal 90mg  total)    [provider]  famotidine (PEPCID) 20 MG tablet Take 1 tablet (20 mg total) by mouth daily. 09/08/21 03/07/22  Menshew, Charlesetta Ivory, PA-C  famotidine (PEPCID) 20 MG tablet Take 20 mg by mouth in the morning.    [provider]  guaiFENesin (ROBITUSSIN) 100 MG/5ML liquid Take 15 mLs by mouth every 6 (six) hours as needed for cough or to loosen phlegm.    [provider]  latanoprost (XALATAN) 0.005 % ophthalmic solution Place 1 drop into both eyes at bedtime. 08/07/21   [provider]  latanoprost (XALATAN) 0.005 % ophthalmic solution Place 1 drop into both eyes daily at 4 PM.    [provider]  loperamide (IMODIUM A-D) 2 MG tablet Take 4 mg by mouth 4 (four) times daily as needed for diarrhea or loose stools.    [provider]  losartan (COZAAR) 100 MG tablet Take 100 mg by mouth daily. 08/22/21   [provider]  losartan (COZAAR) 100 MG tablet Take 100 mg by mouth daily at 4 PM.    [provider]  magnesium hydroxide (MILK OF MAGNESIA) 400 MG/5ML suspension Take 30 mLs by mouth daily as needed for mild constipation or moderate constipation.    [provider]  metoprolol tartrate (LOPRESSOR) 25 MG tablet Take 1 tablet (25  mg total) by mouth 2 (two) times daily. 02/23/23 02/23/24  Willy Eddy, MD  mirtazapine (REMERON) 15 MG tablet Take 15 mg by mouth at bedtime.    [provider]  nitrofurantoin (MACRODANTIN) 50 MG capsule Take 50 mg by mouth daily. 08/22/21   [provider]  nitrofurantoin (MACRODANTIN) 50 MG capsule Take 50 mg by mouth daily at 4 PM.    [provider]  ondansetron (ZOFRAN-ODT) 4 MG disintegrating tablet Take 1 tablet (4 mg total) by mouth every 8 (eight) hours as needed for nausea or vomiting. 11/18/21   Delton Prairie, MD  ondansetron (ZOFRAN-ODT) 4 MG disintegrating tablet Take 4 mg by mouth every 8 (eight) hours as needed for nausea or vomiting.  [provider]  phenol (CHLORASEPTIC) 1.4 % LIQD Use as directed 1 spray in the mouth or throat every 2 (two) hours as needed for throat irritation / pain.    [provider]  PROLIA 60 MG/ML SOSY injection Inject into the skin. 08/10/21   [provider]  QUEtiapine (SEROQUEL) 25 MG tablet Take 25 mg by mouth at bedtime. 08/22/21   [provider]  QUEtiapine (SEROQUEL) 25 MG tablet Take 25 mg by mouth daily as needed (agitation).    [provider]  QUEtiapine (SEROQUEL) 50 MG tablet Take 50 mg by mouth daily at 4 PM.    [provider]  REFRESH TEARS 0.5 % SOLN Place 1 drop into both eyes in the morning, at noon, in the evening, and at bedtime. 08/31/21   [provider]  timolol (TIMOPTIC) 0.25 % ophthalmic solution Place 1 drop into both eyes daily. 08/07/21   [provider]    Physical Exam: Vitals:   03/26/23 0651 03/26/23 0700 03/26/23 0712 03/26/23 0758  BP:  (!) 131/109  (!) 136/98  Pulse: (!) 154 (!) 116  (!) 109  Resp:  (!) 25  (!) 25  Temp:      TempSrc:      SpO2:  97% 98% 92%    Constitutional: NAD, calm, comfortable Vitals:   03/26/23 0651 03/26/23 0700 03/26/23 0712 03/26/23 0758  BP:  (!) 131/109  (!) 136/98  Pulse: (!)  154 (!) 116  (!) 109  Resp:  (!) 25  (!) 25  Temp:      TempSrc:      SpO2:  97% 98% 92%   Eyes: PERRL, lids and conjunctivae normal ENMT: Mucous membranes are dry. Posterior pharynx clear of any exudate or lesions.Normal dentition.  Neck: normal, supple, no masses, no thyromegaly Respiratory: clear to auscultation bilaterally, no wheezing, no crackles. Normal respiratory effort. No accessory muscle use.  Cardiovascular: Irregular heart rate, no murmurs / rubs / gallops. No extremity edema. 2+ pedal pulses. No carotid bruits.  Abdomen: no tenderness, no masses palpated. No hepatosplenomegaly. Bowel sounds positive.  Musculoskeletal: no clubbing / cyanosis. No joint deformity upper and lower extremities. Good ROM, no contractures. Normal muscle tone.  Skin: no rashes, lesions, ulcers. No induration Neurologic: CN 2-12 grossly intact. Sensation intact, DTR normal. Strength 5/5 in all 4.  Psychiatric: Awake, oriented to person and place, confused about time    Labs on Admission: I have personally reviewed following labs and imaging studies  CBC: Recent Labs  Lab 03/26/23 0629  WBC 14.6*  HGB 14.2  HCT 43.7  MCV 86.4  PLT 241   Basic Metabolic Panel: Recent Labs  Lab 03/26/23 0629 03/26/23 0706  NA 139  --   K 3.9  --   CL 103  --   CO2 19*  --   GLUCOSE 177*  --   BUN 38*  --   CREATININE 1.60*  --   CALCIUM 9.0  --   MG  --  2.2   GFR: CrCl cannot be calculated (Unknown ideal weight.). Liver Function Tests: Recent Labs  Lab 03/26/23 0629  AST 23  ALT 19  ALKPHOS 53  BILITOT 1.0  PROT 7.1  ALBUMIN 4.1   Recent Labs  Lab 03/26/23 0629  LIPASE 26   No results for input(s): "AMMONIA" in the last 168 hours. Coagulation Profile: No results for input(s): "INR", "PROTIME" in the last 168 hours. Cardiac Enzymes: No results for input(s): "CKTOTAL", "CKMB", "  CKMBINDEX", "TROPONINI" in the last 168 hours. BNP (last 3 results) No results for input(s): "PROBNP"  in the last 8760 hours. HbA1C: No results for input(s): "HGBA1C" in the last 72 hours. CBG: No results for input(s): "GLUCAP" in the last 168 hours. Lipid Profile: No results for input(s): "CHOL", "HDL", "LDLCALC", "TRIG", "CHOLHDL", "LDLDIRECT" in the last 72 hours. Thyroid Function Tests: No results for input(s): "TSH", "T4TOTAL", "FREET4", "T3FREE", "THYROIDAB" in the last 72 hours. Anemia Panel: No results for input(s): "VITAMINB12", "FOLATE", "FERRITIN", "TIBC", "IRON", "RETICCTPCT" in the last 72 hours. Urine analysis:    Component Value Date/Time   COLORURINE YELLOW (A) 03/26/2023 0817   APPEARANCEUR HAZY (A) 03/26/2023 0817   LABSPEC 1.026 03/26/2023 0817   PHURINE 5.0 03/26/2023 0817   GLUCOSEU NEGATIVE 03/26/2023 0817   HGBUR NEGATIVE 03/26/2023 0817   BILIRUBINUR NEGATIVE 03/26/2023 0817   KETONESUR NEGATIVE 03/26/2023 0817   PROTEINUR 30 (A) 03/26/2023 0817   NITRITE NEGATIVE 03/26/2023 0817   LEUKOCYTESUR NEGATIVE 03/26/2023 0817    Radiological Exams on Admission: CT ABDOMEN PELVIS WO CONTRAST Result Date: 03/26/2023 CLINICAL DATA:  89 year old female with vomiting and diarrhea. EXAM: CT ABDOMEN AND PELVIS WITHOUT CONTRAST TECHNIQUE: Multidetector CT imaging of the abdomen and pelvis was performed following the standard protocol without IV contrast. RADIATION DOSE REDUCTION: This exam was performed according to the departmental dose-optimization program which includes automated exposure control, adjustment of the mA and/or kV according to patient size and/or use of iterative reconstruction technique. COMPARISON:  CT lumbar spine 09/13/2021. FINDINGS: Lower chest: Cardiomegaly. Only trace pericardial fluid which is likely physiologic. Bilateral lung base mosaic attenuation which could be chronic lung disease or combined gas trapping and atelectasis. No pleural effusion or confluent lung base opacity. Hepatobiliary: Negative noncontrast liver and gallbladder. Pancreas:  Partially atrophied. Spleen: Diminutive, negative. Adrenals/Urinary Tract: Adrenal glands are within normal limits. Nonobstructed kidneys, small bilateral simple fluid density renal cysts (no follow-up imaging recommended). No nephrolithiasis. Some asymmetric left renal cortical scarring. Decompressed ureters. Decompressed bladder. Stomach/Bowel: Redundant but decompressed large bowel in the pelvis. Decompressed proximal sigmoid colon with diverticulosis, no active inflammation. Descending and transverse colon are decompressed. Right colon also relatively decompressed. Normal appendix series 3, image 52. No convincing large bowel inflammation. Decompressed terminal ileum. Fluid-filled but nondilated other small bowel loops in the abdomen and pelvis. Mild gas containing stomach. Duodenum is mostly decompressed. No pneumoperitoneum. No free fluid or convincing mesenteric inflammation. Vascular/Lymphatic: Normal caliber abdominal aorta. Aortoiliac calcified atherosclerosis. Vascular patency is not evaluated in the absence of IV contrast. No lymphadenopathy identified. Reproductive: Surgically absent uterus, diminutive or absent ovaries. Other: No pelvis free fluid. Musculoskeletal: No acute osseous abnormality identified. Previous proximal left femur ORIF. IMPRESSION: 1. Fluid-filled but nondilated small bowel loops raising the possibility of Enteritis. Normal appendix and no convincing large bowel inflammation. Mild sigmoid diverticulosis. 2. No other acute or inflammatory process identified in the noncontrast abdomen or pelvis. 3. Cardiomegaly,  Aortic Atherosclerosis (ICD10-I70.0). Electronically Signed   By: Odessa Fleming M.D.   On: 03/26/2023 07:57   DG Chest 1 View Result Date: 03/26/2023 CLINICAL DATA:  89 year old female with vomiting and diarrhea. EXAM: CHEST  1 VIEW COMPARISON:  Chest radiographs 02/23/2023. FINDINGS: Portable AP semi upright view at 0728 hours. Mildly lower lung volumes. Stable cardiomegaly  and mediastinal contours. Stable lung markings. No pneumothorax, pulmonary edema, pleural effusion or acute lung opacity. No acute osseous abnormality identified. Negative visible bowel gas. No evidence of pneumoperitoneum. IMPRESSION: Stable cardiomegaly. No acute cardiopulmonary abnormality.  Electronically Signed   By: Odessa Fleming M.D.   On: 03/26/2023 07:53   CT Head Wo Contrast Result Date: 03/26/2023 CLINICAL DATA:  89 year old female with vomiting and diarrhea. EXAM: CT HEAD WITHOUT CONTRAST TECHNIQUE: Contiguous axial images were obtained from the base of the skull through the vertex without intravenous contrast. RADIATION DOSE REDUCTION: This exam was performed according to the departmental dose-optimization program which includes automated exposure control, adjustment of the mA and/or kV according to patient size and/or use of iterative reconstruction technique. COMPARISON:  Head CT 11/18/2021.  Brain MRI 09/13/2021. FINDINGS: Brain: Stable cerebral volume. Patchy and confluent scattered cerebral white matter, left basal ganglia hypodensity compatible with chronic small vessel disease and stable compared to last year. No midline shift, ventriculomegaly, mass effect, evidence of mass lesion, intracranial hemorrhage or evidence of cortically based acute infarction. Vascular: Extensive calcified atherosclerosis at the skull base. No suspicious intracranial vascular hyperdensity. Skull: Chronic bilateral TMJ degeneration. No acute osseous abnormality identified. Sinuses/Orbits: Visualized paranasal sinuses and mastoids are well aerated. Other: Leftward gaze. Visualized scalp soft tissues are within normal limits. IMPRESSION: No acute intracranial abnormality. Stable non contrast CT appearance of chronic cerebral small vessel disease. Electronically Signed   By: Odessa Fleming M.D.   On: 03/26/2023 07:52    EKG: Independently reviewed.  A-fib with RVR, no acute ST changes.  Assessment/Plan Principal Problem:    AKI (acute kidney injury) (HCC) Active Problems:   Gastroenteritis   Paroxysmal A-fib (HCC)  (please populate well all problems here in Problem List. (For example, if patient is on BP meds at home and you resume or decide to hold them, it is a problem that needs to be her. Same for CAD, COPD, HLD and so on)  A-fib with RVR -BP borderline low, will not initiate cardizem drip -Increase metoprolol from 25 mg twice daily to 50 mg daily and start PRN lopressor for breakthrough HR -K> 4.0 and magnesium= 2.1 -Continue Eliquis -Hold off amlodipine -Patient was just recently diagnosed with new onset of A-fib during the ED visit 1 month ago at [facility].  Eliquis was started by ED physician and continued by cardiology [facility] clinic  AKI with acute anion gap and non-anion gap metabolic acidosis -Prerenal secondary to acute GI loss from nauseous vomiting diarrhea -Clinically still appears to be volume contracted, plan to give gentle hydration for 1 day repeat BMP tomorrow  Acute gastroenteritis -Symptoms appear to be self-limiting.  CT abdominal pelvis reassuring. -Hold off antibiotics -IV fluid as above  SIRS -With tachycardia, leukocytosis, elevated lactate, doubt there is any bacterial infection and sepsis.  Most of her symptoms signs cannot be attributed to acute gastroenteritis which appears to be self-limiting thus noninfectious in nature.  Monitor off antibiotics -Symptomatic management  HTN -On Cardizem drip, BP is fairly controlled, plan to hold off amlodipine and losartan  Multiple strokes in the past -Continue Plavix, continue Eliquis -On statin  Advanced dementia with sundowning -On multiple SSRI -PM and HS Seroquel to address sundowning   DVT prophylaxis: Eliquis Code Status: Full code Family Communication: Left daughter message over the phone Disposition Plan: Expect less than 2 midnight hospital stay Consults called: None Admission status: PCU observation   Emeline General  MD [facility] Pager [phone]  03/26/2023, 9:05 AM

## 2023-03-26 NOTE — ED Triage Notes (Signed)
Pt to ED via [facility] from [place] [facility] [place] c/o vomiting and diarrhea since last night. Pt denies any abdominal pain. Pt only complaint right now is that she is thirsty, denies CP, SOB, fevers, dizziness. Pt has hx dementia, A&Ox3 disoriented to time

## 2023-03-26 NOTE — ED Notes (Signed)
Patient is eating lunch, fluids paused due to constant alarming caused by bending her arm. Patient refuses to have the IV placed in a better spot at this time. Patient is oriented to self at this time.

## 2023-03-26 NOTE — ED Provider Notes (Signed)
[facility] Provider Note    Event Date/Time   First MD Initiated Contact with Patient 03/26/23 [id number]     (approximate)   History   Emesis and Diarrhea   HPI  Brooke Pierce is a 89 y.o. female history of depression, vascular dementia, hypertension   Patient has history of dementia unable to provide good history.  She reports not being any pain or discomfort.  She feels dry and would like some ice chips.  Specifically denies chest pain or shortness of breath.  She does have some nausea but no active vomiting.  EMS reports they were called out for nausea vomiting and diarrhea.  Physical Exam   Triage Vital Signs: ED Triage Vitals  Encounter Vitals Group     BP 03/26/23 0627 (!) 144/93     Systolic BP Percentile --      Diastolic BP Percentile --      Pulse Rate 03/26/23 0627 (!) 122     Resp 03/26/23 0627 18     Temp 03/26/23 0627 97.8 F (36.6 C)     Temp Source 03/26/23 0627 Oral     SpO2 03/26/23 0627 96 %     Weight --      Height --      Head Circumference --      Peak Flow --      Pain Score 03/26/23 0624 0     Pain Loc --      Pain Education --      Exclude from Growth Chart --     Most recent vital signs: Vitals:   03/26/23 0712 03/26/23 0758  BP:  (!) 136/98  Pulse:  (!) 109  Resp:  (!) 25  Temp:    SpO2: 98% 92%     General: Awake, no distress.  Mucous membranes slightly dry.  Normocephalic atraumatic CV:  Good peripheral perfusion.  Very rapid tachycardic heart rate varying from 120s to 150s on telemetry it appears to be A-fib no P waves are present with irregularity Resp:  Normal effort.  Normal work of breathing clear bilateral.  No cough or wheezes Abd:  No distention.  Soft nontender nondistended throughout. Other:  Very mild lower extremity edema bilateral.  No unilateral leg swelling.  No unilateral swelling of any extremity or obvious signs of DVT venous cords or congestion   ED Results / Procedures /  Treatments   Labs (all labs ordered are listed, but only abnormal results are displayed) Labs Reviewed  COMPREHENSIVE METABOLIC PANEL - Abnormal; Notable for the following components:      Result Value   CO2 19 (*)    Glucose, Bld 177 (*)    BUN 38 (*)    Creatinine, Ser 1.60 (*)    GFR, Estimated 31 (*)    Anion gap 17 (*)    All other components within normal limits  CBC - Abnormal; Notable for the following components:   WBC 14.6 (*)    All other components within normal limits  URINALYSIS, ROUTINE W REFLEX MICROSCOPIC - Abnormal; Notable for the following components:   Color, Urine YELLOW (*)    APPearance HAZY (*)    Protein, ur 30 (*)    All other components within normal limits  LACTIC ACID, PLASMA - Abnormal; Notable for the following components:   Lactic Acid, Venous 3.4 (*)    All other components within normal limits  D-DIMER, QUANTITATIVE - Abnormal; Notable for the following components:  D-Dimer, Quant 0.51 (*)    All other components within normal limits  RESP PANEL BY RT-PCR (RSV, FLU A&B, COVID)  RVPGX2  LIPASE, BLOOD  MAGNESIUM  TROPONIN I (HIGH SENSITIVITY)     EKG  Interpreted by me at approximately 7 AM heart rate 130 QRS 100 QTc 440 Atrial fibrillation with rapid ventricular response.  Some artifact versus mild rate related changes also felt to be present.  No STEMI or frank findings of ischemia.  No discernible P waves with irregularity most suggestive of A-fib with RVR   RADIOLOGY  CT ABDOMEN PELVIS WO CONTRAST Result Date: 03/26/2023 CLINICAL DATA:  89 year old female with vomiting and diarrhea. EXAM: CT ABDOMEN AND PELVIS WITHOUT CONTRAST TECHNIQUE: Multidetector CT imaging of the abdomen and pelvis was performed following the standard protocol without IV contrast. RADIATION DOSE REDUCTION: This exam was performed according to the departmental dose-optimization program which includes automated exposure control, adjustment of the mA and/or kV  according to patient size and/or use of iterative reconstruction technique. COMPARISON:  CT lumbar spine 09/13/2021. FINDINGS: Lower chest: Cardiomegaly. Only trace pericardial fluid which is likely physiologic. Bilateral lung base mosaic attenuation which could be chronic lung disease or combined gas trapping and atelectasis. No pleural effusion or confluent lung base opacity. Hepatobiliary: Negative noncontrast liver and gallbladder. Pancreas: Partially atrophied. Spleen: Diminutive, negative. Adrenals/Urinary Tract: Adrenal glands are within normal limits. Nonobstructed kidneys, small bilateral simple fluid density renal cysts (no follow-up imaging recommended). No nephrolithiasis. Some asymmetric left renal cortical scarring. Decompressed ureters. Decompressed bladder. Stomach/Bowel: Redundant but decompressed large bowel in the pelvis. Decompressed proximal sigmoid colon with diverticulosis, no active inflammation. Descending and transverse colon are decompressed. Right colon also relatively decompressed. Normal appendix series 3, image 52. No convincing large bowel inflammation. Decompressed terminal ileum. Fluid-filled but nondilated other small bowel loops in the abdomen and pelvis. Mild gas containing stomach. Duodenum is mostly decompressed. No pneumoperitoneum. No free fluid or convincing mesenteric inflammation. Vascular/Lymphatic: Normal caliber abdominal aorta. Aortoiliac calcified atherosclerosis. Vascular patency is not evaluated in the absence of IV contrast. No lymphadenopathy identified. Reproductive: Surgically absent uterus, diminutive or absent ovaries. Other: No pelvis free fluid. Musculoskeletal: No acute osseous abnormality identified. Previous proximal left femur ORIF. IMPRESSION: 1. Fluid-filled but nondilated small bowel loops raising the possibility of Enteritis. Normal appendix and no convincing large bowel inflammation. Mild sigmoid diverticulosis. 2. No other acute or inflammatory  process identified in the noncontrast abdomen or pelvis. 3. Cardiomegaly,  Aortic Atherosclerosis (ICD10-I70.0). Electronically Signed   By: Odessa Fleming M.D.   On: 03/26/2023 07:57   DG Chest 1 View Result Date: 03/26/2023 CLINICAL DATA:  89 year old female with vomiting and diarrhea. EXAM: CHEST  1 VIEW COMPARISON:  Chest radiographs 02/23/2023. FINDINGS: Portable AP semi upright view at 0728 hours. Mildly lower lung volumes. Stable cardiomegaly and mediastinal contours. Stable lung markings. No pneumothorax, pulmonary edema, pleural effusion or acute lung opacity. No acute osseous abnormality identified. Negative visible bowel gas. No evidence of pneumoperitoneum. IMPRESSION: Stable cardiomegaly. No acute cardiopulmonary abnormality. Electronically Signed   By: Odessa Fleming M.D.   On: 03/26/2023 07:53   CT Head Wo Contrast Result Date: 03/26/2023 CLINICAL DATA:  89 year old female with vomiting and diarrhea. EXAM: CT HEAD WITHOUT CONTRAST TECHNIQUE: Contiguous axial images were obtained from the base of the skull through the vertex without intravenous contrast. RADIATION DOSE REDUCTION: This exam was performed according to the departmental dose-optimization program which includes automated exposure control, adjustment of the mA and/or kV according to  patient size and/or use of iterative reconstruction technique. COMPARISON:  Head CT 11/18/2021.  Brain MRI 09/13/2021. FINDINGS: Brain: Stable cerebral volume. Patchy and confluent scattered cerebral white matter, left basal ganglia hypodensity compatible with chronic small vessel disease and stable compared to last year. No midline shift, ventriculomegaly, mass effect, evidence of mass lesion, intracranial hemorrhage or evidence of cortically based acute infarction. Vascular: Extensive calcified atherosclerosis at the skull base. No suspicious intracranial vascular hyperdensity. Skull: Chronic bilateral TMJ degeneration. No acute osseous abnormality identified.  Sinuses/Orbits: Visualized paranasal sinuses and mastoids are well aerated. Other: Leftward gaze. Visualized scalp soft tissues are within normal limits. IMPRESSION: No acute intracranial abnormality. Stable non contrast CT appearance of chronic cerebral small vessel disease. Electronically Signed   By: Odessa Fleming M.D.   On: 03/26/2023 07:52    CT imaging demonstrates dilated loops of small bowel possible enteritis like findings.  Grossly interpreted by me as negative for acute pathology aside from the small bowel inflammation noted.   PROCEDURES:  Critical Care performed: Yes, see critical care procedure note(s) CRITICAL CARE Performed by: Sharyn Creamer   Total critical care time: 30 minutes  Critical care time was exclusive of separately billable procedures and treating other patients.  Critical care was necessary to treat or prevent imminent or life-threatening deterioration.  Critical care was time spent personally by me on the following activities: development of treatment plan with patient and/or surrogate as well as nursing, discussions with consultants, evaluation of patient's response to treatment, examination of patient, obtaining history from patient or surrogate, ordering and performing treatments and interventions, ordering and review of laboratory studies, ordering and review of radiographic studies, pulse oximetry and re-evaluation of patient's condition.  Procedures   MEDICATIONS ORDERED IN ED:    IMPRESSION / MDM / ASSESSMENT AND PLAN / ED COURSE  I reviewed the triage vital signs and the nursing notes.                              Differential diagnosis includes, but is not limited to, new onset tachydysrhythmia, cardiac ischemia, pulmonary embolism though no hypoxia and she does not complain of any chest pain making it seemingly less likely, acute metabolic abnormality, electrolyte abnormality, pneumonia, infection, intra-abdominal cause such as obstruction though her  abdominal exam is quite reassuring but she does report diarrhea and vomiting and does appear to have some nausea.  Differential diagnosis quite broad  Based on her heart rate variability from the 130s to 150s and her nausea, I am not certain if there is a potential demand ischemia or associated symptomatology from the A-fib.  I do think it would be reasonable to initiate a small diltiazem bolus, hydrate her with IV fluid as her mucous membranes appear dry and clinical history suggest she could have hypovolemia, as we await further testing.  Full cardiac monitoring.    Patient's presentation is most consistent with acute presentation with potential threat to life or bodily function.   The patient is on the cardiac monitor to evaluate for evidence of arrhythmia and/or significant heart rate changes.      I left a voicemail, HIPAA compliant, for Mrs. Meriam Sprague to give [place] a call back.   ----------------------------------------- 9:21 AM on 03/26/2023 ----------------------------------------- Consulted with patient accepted to hospitalist by Dr. Chipper Herb.  ED Sepsis - Repeat Assessment   Performed at:     Vitals:   03/26/23 0712 03/26/23 0758  BP:  Marland Kitchen)  136/98  Pulse:  (!) 109  Resp:  (!) 25  Temp:    SpO2: 98% 92%    Patient improved resting comfortably heart rate improved.  At this point, I do not see evidence of sepsis from a bacterial source or obvious fungal infection.  I am suspicious for possible self-limited gastrointestinal illness including possible cause of enteritis, stool cultures are pending.  No clear indication for IV antibiotics at this time.  Further care treatment under the hospitalist service  FINAL CLINICAL IMPRESSION(S) / ED DIAGNOSES   Final diagnoses:  SIRS (systemic inflammatory response syndrome) (HCC)  Atrial fibrillation, unspecified type (HCC)  AKI (acute kidney injury) (HCC)  Atrial fibrillation, rapid (HCC)     Rx / DC Orders   ED Discharge  Orders     None        Note:  This document was prepared using Dragon voice recognition software and may include unintentional dictation errors.   Sharyn Creamer, MD 03/26/23 [phone]

## 2023-03-26 NOTE — ED Notes (Signed)
POA at bedside requested to speak with hospitalist who came down to the room to speak with her. Patient is quiet, refused for this writer to change the IV site so the pump wouldn't alarm.

## 2023-03-26 NOTE — ED Notes (Signed)
Pt set up for breakfast.

## 2023-03-26 NOTE — ED Notes (Signed)
ED TO INPATIENT HANDOFF REPORT  ED Nurse Name and Phone #:   S Name/Age/Gender Brooke Pierce 89 y.o. female Room/Bed: ED37A/ED37A  Code Status   Code Status: Full Code  Home/SNF/Other Nursing Home Patient oriented to: self Is this baseline? Yes   Triage Complete: Triage complete  Chief Complaint AKI (acute kidney injury) (HCC) [N17.9]  Triage Note Pt to ED via [facility] [place] c/o vomiting and diarrhea since last night. Pt denies any abdominal pain. Pt only complaint right now is that she is thirsty, denies CP, SOB, fevers, dizziness. Pt has hx dementia, A&Ox3 disoriented to time   Allergies Allergies  Allergen Reactions   Calcium-Containing Compounds Nausea And Vomiting   Calcium-Containing Compounds    Coreg [Carvedilol] Itching   Coreg [Carvedilol]    Iodine     Level of Care/Admitting Diagnosis ED Disposition     ED Disposition  Admit   Condition  --   Comment  Hospital Area: [facility] [[id number]]  Level of Care: Progressive [102]  Admit to Progressive based on following criteria: CARDIOVASCULAR & THORACIC of moderate stability with acute coronary syndrome symptoms/low risk myocardial infarction/hypertensive urgency/arrhythmias/heart failure potentially compromising stability and stable post cardiovascular intervention patients.  Covid Evaluation: Asymptomatic - no recent exposure (last 10 days) testing not required  Diagnosis: AKI (acute kidney injury) [facility]) [[id number]]  Admitting Physician: Emeline General [[id number]]  Attending Physician: Emeline General [[id number]]          B Medical/Surgery History Past Medical History:  Diagnosis Date   Anxiety    Carotid atherosclerosis    Cataract    Depression    Glaucoma    HTN (hypertension)    Hypertension    Insomnia    Osteoporosis    Restless leg syndrome    Sensorineural hearing loss    Stroke [facility])    Vascular dementia (HCC)    History reviewed. No pertinent  surgical history.   A IV Location/Drains/Wounds Patient Lines/Drains/Airways Status     Active Line/Drains/Airways     Name Placement date Placement time Site Days   Peripheral IV 03/26/23 20 G Left Antecubital 03/26/23  0711  Antecubital  Pierce than 1            Intake/Output Last 24 hours  Intake/Output Summary (Last 24 hours) at 03/26/2023 1745 Last data filed at 03/26/2023 2956 Gross per 24 hour  Intake 500 ml  Output --  Net 500 ml    Labs/Imaging Results for orders placed or performed during the hospital encounter of 03/26/23 (from the past 48 hours)  Lipase, blood     Status: None   Collection Time: 03/26/23  6:29 AM  Result Value Ref Range   Lipase 26 11 - 51 U/L    Comment: Performed at [facility], [address] [place], [place] [address]  Comprehensive metabolic panel     Status: Abnormal   Collection Time: 03/26/23  6:29 AM  Result Value Ref Range   Sodium 139 135 - 145 mmol/L   Potassium 3.9 3.5 - 5.1 mmol/L   Chloride 103 98 - 111 mmol/L   CO2 19 (L) 22 - 32 mmol/L   Glucose, Bld 177 (H) 70 - 99 mg/dL    Comment: Glucose reference range applies only to samples taken after fasting for at least 8 hours.   BUN 38 (H) 8 - 23 mg/dL   Creatinine, Ser 6.57 (H) 0.44 - 1.00 mg/dL   Calcium 9.0 8.9 -  10.3 mg/dL   Total Protein 7.1 6.5 - 8.1 g/dL   Albumin 4.1 3.5 - 5.0 g/dL   AST 23 15 - 41 U/L   ALT 19 0 - 44 U/L   Alkaline Phosphatase 53 38 - 126 U/L   Total Bilirubin 1.0 <1.2 mg/dL   GFR, Estimated 31 (L) >60 mL/min    Comment: (NOTE) Calculated using the CKD-EPI Creatinine Equation (2021)    Anion gap 17 (H) 5 - 15    Comment: Performed at [facility], [address] [place], [place] [address]  CBC     Status: Abnormal   Collection Time: 03/26/23  6:29 AM  Result Value Ref Range   WBC 14.6 (H) 4.0 - 10.5 K/uL   RBC 5.06 3.87 - 5.11 MIL/uL   Hemoglobin 14.2 12.0 - 15.0 g/dL   HCT 13.0 86.5 - 78.4 %   MCV 86.4 80.0 - 100.0  fL   MCH 28.1 26.0 - 34.0 pg   MCHC 32.5 30.0 - 36.0 g/dL   RDW 69.6 29.5 - 28.4 %   Platelets 241 150 - 400 K/uL   nRBC 0.0 0.0 - 0.2 %    Comment: Performed at [facility], [address] [place], [place] [address]  Lactic acid, plasma     Status: Abnormal   Collection Time: 03/26/23  7:06 AM  Result Value Ref Range   Lactic Acid, Venous 3.4 (HH) 0.5 - 1.9 mmol/L    Comment: CRITICAL RESULT CALLED TO, READ BACK BY AND VERIFIED WITH Antwanette Burlingame AT 0807 03/26/2023 DLB Performed at [facility], [address] [place], [place] [address]   Troponin I (High Sensitivity)     Status: None   Collection Time: 03/26/23  7:06 AM  Result Value Ref Range   Troponin I (High Sensitivity) 12 <18 ng/L    Comment: (NOTE) Elevated high sensitivity troponin I (hsTnI) values and significant  changes across serial measurements may suggest ACS but many other  chronic and acute conditions are known to elevate hsTnI results.  Refer to the "Links" section for chest pain algorithms and additional  guidance. Performed at [facility], [address] [place], [place] [address]   D-dimer, quantitative     Status: Abnormal   Collection Time: 03/26/23  7:06 AM  Result Value Ref Range   D-Dimer, Quant 0.51 (H) 0.00 - 0.50 ug/mL-FEU    Comment: (NOTE) At the manufacturer cut-off value of 0.5 g/mL FEU, this assay has a negative predictive value of 95-100%.This assay is intended for use in conjunction with a clinical pretest probability (PTP) assessment model to exclude pulmonary embolism (PE) and deep venous thrombosis (DVT) in outpatients suspected of PE or DVT. Results should be correlated with clinical presentation. Performed at [facility], [address] [place], [place] [address]   Magnesium     Status: None   Collection Time: 03/26/23  7:06 AM  Result Value Ref Range   Magnesium 2.2 1.7 - 2.4 mg/dL    Comment: Performed at [facility],  [address] [place], [place] [address]  Resp panel by RT-PCR (RSV, Flu A&B, Covid) Anterior Nasal Swab     Status: None   Collection Time: 03/26/23  7:28 AM   Specimen: Anterior Nasal Swab  Result Value Ref Range   SARS Coronavirus 2 by RT PCR NEGATIVE NEGATIVE    Comment: (NOTE) SARS-CoV-2 target nucleic acids are NOT DETECTED.  The SARS-CoV-2 RNA is generally detectable in upper  respiratory specimens during the acute phase of infection. The lowest concentration of SARS-CoV-2 viral copies this assay can detect is 138 copies/mL. A negative result does not preclude SARS-Cov-2 infection and should not be used as the sole basis for treatment or other patient management decisions. A negative result may occur with  improper specimen collection/handling, submission of specimen other than nasopharyngeal swab, presence of viral mutation(s) within the areas targeted by this assay, and inadequate number of viral copies(<138 copies/mL). A negative result must be combined with clinical observations, patient history, and epidemiological information. The expected result is Negative.  Fact Sheet for Patients:  BloggerCourse.com  Fact Sheet for Healthcare Providers:  SeriousBroker.it  This test is no t yet approved or cleared by the [place] FDA and  has been authorized for detection and/or diagnosis of SARS-CoV-2 by FDA under an Emergency Use Authorization (EUA). This EUA will remain  in effect (meaning this test can be used) for the duration of the COVID-19 declaration under Section 564(b)(1) of the Act, 21 U.S.C.section 360bbb-3(b)(1), unless the authorization is terminated  or revoked sooner.       Influenza A by PCR NEGATIVE NEGATIVE   Influenza B by PCR NEGATIVE NEGATIVE    Comment: (NOTE) The Xpert Xpress SARS-CoV-2/FLU/RSV plus assay is intended as an aid in the diagnosis of influenza from Nasopharyngeal swab specimens  and should not be used as a sole basis for treatment. Nasal washings and aspirates are unacceptable for Xpert Xpress SARS-CoV-2/FLU/RSV testing.  Fact Sheet for Patients: BloggerCourse.com  Fact Sheet for Healthcare Providers: SeriousBroker.it  This test is not yet approved or cleared by the [place] FDA and has been authorized for detection and/or diagnosis of SARS-CoV-2 by FDA under an Emergency Use Authorization (EUA). This EUA will remain in effect (meaning this test can be used) for the duration of the COVID-19 declaration under Section 564(b)(1) of the Act, 21 U.S.C. section 360bbb-3(b)(1), unless the authorization is terminated or revoked.     Resp Syncytial Virus by PCR NEGATIVE NEGATIVE    Comment: (NOTE) Fact Sheet for Patients: BloggerCourse.com  Fact Sheet for Healthcare Providers: SeriousBroker.it  This test is not yet approved or cleared by the [place] FDA and has been authorized for detection and/or diagnosis of SARS-CoV-2 by FDA under an Emergency Use Authorization (EUA). This EUA will remain in effect (meaning this test can be used) for the duration of the COVID-19 declaration under Section 564(b)(1) of the Act, 21 U.S.C. section 360bbb-3(b)(1), unless the authorization is terminated or revoked.  Performed at [facility], [address] [place], [place] [address]   Urinalysis, Routine w reflex microscopic -Urine, Clean Catch     Status: Abnormal   Collection Time: 03/26/23  8:17 AM  Result Value Ref Range   Color, Urine YELLOW (A) YELLOW   APPearance HAZY (A) CLEAR   Specific Gravity, Urine 1.026 1.005 - 1.030   pH 5.0 5.0 - 8.0   Glucose, UA NEGATIVE NEGATIVE mg/dL   Hgb urine dipstick NEGATIVE NEGATIVE   Bilirubin Urine NEGATIVE NEGATIVE   Ketones, ur NEGATIVE NEGATIVE mg/dL   Protein, ur 30 (A) NEGATIVE mg/dL   Nitrite NEGATIVE  NEGATIVE   Leukocytes,Ua NEGATIVE NEGATIVE   RBC / HPF 0-5 0 - 5 RBC/hpf   WBC, UA 0-5 0 - 5 WBC/hpf   Bacteria, UA NONE SEEN NONE SEEN   Squamous Epithelial / HPF 0-5 0 - 5 /HPF   Mucus PRESENT    Hyaline Casts, UA PRESENT  Comment: Performed at [facility], [address] [place], [place] [address]   CT ABDOMEN PELVIS WO CONTRAST Result Date: 03/26/2023 CLINICAL DATA:  89 year old female with vomiting and diarrhea. EXAM: CT ABDOMEN AND PELVIS WITHOUT CONTRAST TECHNIQUE: Multidetector CT imaging of the abdomen and pelvis was performed following the standard protocol without IV contrast. RADIATION DOSE REDUCTION: This exam was performed according to the departmental dose-optimization program which includes automated exposure control, adjustment of the mA and/or kV according to patient size and/or use of iterative reconstruction technique. COMPARISON:  CT lumbar spine 09/13/2021. FINDINGS: Lower chest: Cardiomegaly. Only trace pericardial fluid which is likely physiologic. Bilateral lung base mosaic attenuation which could be chronic lung disease or combined gas trapping and atelectasis. No pleural effusion or confluent lung base opacity. Hepatobiliary: Negative noncontrast liver and gallbladder. Pancreas: Partially atrophied. Spleen: Diminutive, negative. Adrenals/Urinary Tract: Adrenal glands are within normal limits. Nonobstructed kidneys, small bilateral simple fluid density renal cysts (no follow-up imaging recommended). No nephrolithiasis. Some asymmetric left renal cortical scarring. Decompressed ureters. Decompressed bladder. Stomach/Bowel: Redundant but decompressed large bowel in the pelvis. Decompressed proximal sigmoid colon with diverticulosis, no active inflammation. Descending and transverse colon are decompressed. Right colon also relatively decompressed. Normal appendix series 3, image 52. No convincing large bowel inflammation. Decompressed terminal ileum. Fluid-filled but  nondilated other small bowel loops in the abdomen and pelvis. Mild gas containing stomach. Duodenum is mostly decompressed. No pneumoperitoneum. No free fluid or convincing mesenteric inflammation. Vascular/Lymphatic: Normal caliber abdominal aorta. Aortoiliac calcified atherosclerosis. Vascular patency is not evaluated in the absence of IV contrast. No lymphadenopathy identified. Reproductive: Surgically absent uterus, diminutive or absent ovaries. Other: No pelvis free fluid. Musculoskeletal: No acute osseous abnormality identified. Previous proximal left femur ORIF. IMPRESSION: 1. Fluid-filled but nondilated small bowel loops raising the possibility of Enteritis. Normal appendix and no convincing large bowel inflammation. Mild sigmoid diverticulosis. 2. No other acute or inflammatory process identified in the noncontrast abdomen or pelvis. 3. Cardiomegaly,  Aortic Atherosclerosis (ICD10-I70.0). Electronically Signed   By: Odessa Fleming M.D.   On: 03/26/2023 07:57   DG Chest 1 View Result Date: 03/26/2023 CLINICAL DATA:  89 year old female with vomiting and diarrhea. EXAM: CHEST  1 VIEW COMPARISON:  Chest radiographs 02/23/2023. FINDINGS: Portable AP semi upright view at 0728 hours. Mildly lower lung volumes. Stable cardiomegaly and mediastinal contours. Stable lung markings. No pneumothorax, pulmonary edema, pleural effusion or acute lung opacity. No acute osseous abnormality identified. Negative visible bowel gas. No evidence of pneumoperitoneum. IMPRESSION: Stable cardiomegaly. No acute cardiopulmonary abnormality. Electronically Signed   By: Odessa Fleming M.D.   On: 03/26/2023 07:53   CT Head Wo Contrast Result Date: 03/26/2023 CLINICAL DATA:  89 year old female with vomiting and diarrhea. EXAM: CT HEAD WITHOUT CONTRAST TECHNIQUE: Contiguous axial images were obtained from the base of the skull through the vertex without intravenous contrast. RADIATION DOSE REDUCTION: This exam was performed according to the  departmental dose-optimization program which includes automated exposure control, adjustment of the mA and/or kV according to patient size and/or use of iterative reconstruction technique. COMPARISON:  Head CT 11/18/2021.  Brain MRI 09/13/2021. FINDINGS: Brain: Stable cerebral volume. Patchy and confluent scattered cerebral white matter, left basal ganglia hypodensity compatible with chronic small vessel disease and stable compared to last year. No midline shift, ventriculomegaly, mass effect, evidence of mass lesion, intracranial hemorrhage or evidence of cortically based acute infarction. Vascular: Extensive calcified atherosclerosis at the skull base. No suspicious intracranial vascular hyperdensity. Skull: Chronic bilateral TMJ degeneration. No  acute osseous abnormality identified. Sinuses/Orbits: Visualized paranasal sinuses and mastoids are well aerated. Other: Leftward gaze. Visualized scalp soft tissues are within normal limits. IMPRESSION: No acute intracranial abnormality. Stable non contrast CT appearance of chronic cerebral small vessel disease. Electronically Signed   By: Odessa Fleming M.D.   On: 03/26/2023 07:52    Pending Labs Unresulted Labs (From admission, onward)     Start     Ordered   03/27/23 0500  Basic metabolic panel  Tomorrow morning,   R        03/26/23 0856   03/27/23 0500  CBC  Tomorrow morning,   R        03/26/23 0856            Vitals/Pain Today's Vitals   03/26/23 1517 03/26/23 1530 03/26/23 1554 03/26/23 1621  BP: (!) 144/69 (!) 149/66 139/74   Pulse: 74 77 (!) 117 76  Resp: (!) 22 (!) 28  18  Temp: 98.5 F (36.9 C)     TempSrc:      SpO2: 97% 93%  94%  PainSc:        Isolation Precautions No active isolations  Medications Medications  ondansetron (ZOFRAN) injection 4 mg (has no administration in time range)  0.9 %  sodium chloride infusion ( Intravenous New Bag/Given 03/26/23 0916)  acetaminophen (TYLENOL) tablet 650 mg (has no administration in time  range)  aspirin EC tablet 81 mg (81 mg Oral Given 03/26/23 1238)  atorvastatin (LIPITOR) tablet 40 mg (40 mg Oral Given 03/26/23 1238)  DULoxetine (CYMBALTA) DR capsule 30 mg (30 mg Oral Given 03/26/23 1236)  DULoxetine (CYMBALTA) DR capsule 60 mg (60 mg Oral Given 03/26/23 1237)  mirtazapine (REMERON) tablet 15 mg (has no administration in time range)  QUEtiapine (SEROQUEL) tablet 25 mg (has no administration in time range)  famotidine (PEPCID) tablet 20 mg (20 mg Oral Given 03/26/23 1238)  apixaban (ELIQUIS) tablet 5 mg (5 mg Oral Given 03/26/23 1235)  polyvinyl alcohol (LIQUIFILM TEARS) 1.4 % ophthalmic solution 1 drop (1 drop Both Eyes Given 03/26/23 1554)  dorzolamide-timolol (COSOPT) 2-0.5 % ophthalmic solution 1 drop (has no administration in time range)  lactobacillus (FLORANEX/LACTINEX) granules 1 g (has no administration in time range)  metoprolol tartrate (LOPRESSOR) injection 5 mg (has no administration in time range)  metoprolol tartrate (LOPRESSOR) tablet 50 mg (50 mg Oral Given 03/26/23 1554)  sodium chloride 0.9 % bolus 500 mL (0 mLs Intravenous Stopped 03/26/23 0916)  diltiazem (CARDIZEM) injection 10 mg (10 mg Intravenous Given 03/26/23 0721)  ondansetron (ZOFRAN) injection 4 mg (4 mg Intravenous Given 03/26/23 0726)    Mobility non-ambulatory     Focused Assessments    R Recommendations: See Admitting Provider Note  Report given to:   Additional Notes:

## 2023-03-27 DIAGNOSIS — I480 Paroxysmal atrial fibrillation: Secondary | ICD-10-CM

## 2023-03-27 DIAGNOSIS — N179 Acute kidney failure, unspecified: Secondary | ICD-10-CM | POA: Diagnosis not present

## 2023-03-27 DIAGNOSIS — E872 Acidosis, unspecified: Secondary | ICD-10-CM | POA: Insufficient documentation

## 2023-03-27 DIAGNOSIS — K529 Noninfective gastroenteritis and colitis, unspecified: Secondary | ICD-10-CM

## 2023-03-27 DIAGNOSIS — E876 Hypokalemia: Secondary | ICD-10-CM | POA: Insufficient documentation

## 2023-03-27 LAB — CBC
HCT: 33.4 % — ABNORMAL LOW (ref 36.0–46.0)
Hemoglobin: 10.8 g/dL — ABNORMAL LOW (ref 12.0–15.0)
MCH: 27.4 pg (ref 26.0–34.0)
MCHC: 32.3 g/dL (ref 30.0–36.0)
MCV: 84.8 fL (ref 80.0–100.0)
Platelets: 155 10*3/uL (ref 150–400)
RBC: 3.94 MIL/uL (ref 3.87–5.11)
RDW: 13.5 % (ref 11.5–15.5)
WBC: 4.2 10*3/uL (ref 4.0–10.5)
nRBC: 0 % (ref 0.0–0.2)

## 2023-03-27 LAB — BASIC METABOLIC PANEL
Anion gap: 9 (ref 5–15)
BUN: 20 mg/dL (ref 8–23)
CO2: 19 mmol/L — ABNORMAL LOW (ref 22–32)
Calcium: 7.5 mg/dL — ABNORMAL LOW (ref 8.9–10.3)
Chloride: 108 mmol/L (ref 98–111)
Creatinine, Ser: 0.98 mg/dL (ref 0.44–1.00)
GFR, Estimated: 55 mL/min — ABNORMAL LOW (ref >60)
Glucose, Bld: 102 mg/dL — ABNORMAL HIGH (ref 70–99)
Potassium: 3.4 mmol/L — ABNORMAL LOW (ref 3.5–5.1)
Sodium: 136 mmol/L (ref 135–145)

## 2023-03-27 LAB — LACTIC ACID, PLASMA: Lactic Acid, Venous: 1 mmol/L (ref 0.5–1.9)

## 2023-03-27 MED ORDER — POTASSIUM CHLORIDE CRYS ER 10 MEQ PO TBCR: 10.0000 meq | EXTENDED_RELEASE_TABLET | Freq: Two times a day (BID) | ORAL | 0 refills | Status: DC

## 2023-03-27 MED ORDER — METOPROLOL TARTRATE 25 MG PO TABS: 50.0000 mg | ORAL_TABLET | Freq: Two times a day (BID) | ORAL | Status: DC

## 2023-03-27 MED ORDER — SODIUM BICARBONATE 650 MG PO TABS: 650.0000 mg | ORAL_TABLET | Freq: Two times a day (BID) | ORAL | Status: DC

## 2023-03-27 MED ORDER — SODIUM BICARBONATE 650 MG PO TABS: 650.0000 mg | ORAL_TABLET | Freq: Two times a day (BID) | ORAL | 0 refills | Status: AC

## 2023-03-27 MED ORDER — ASPIRIN 81 MG PO TBEC: 81.0000 mg | DELAYED_RELEASE_TABLET | Freq: Every day | ORAL | Status: DC

## 2023-03-27 MED ORDER — POTASSIUM CHLORIDE CRYS ER 20 MEQ PO TBCR
40.0000 meq | EXTENDED_RELEASE_TABLET | Freq: Once | ORAL | Status: AC
  Administered 2023-03-27: 40 meq via ORAL
  Filled 2023-03-27: qty 2

## 2023-03-27 NOTE — Evaluation (Signed)
Occupational Therapy Evaluation Patient Details Name: Brooke Pierce MRN: [id number] DOB: Aug 10, 1931 Today's Date: 03/27/2023   History of Present Illness Pt is an 89 y/o female admitted secondary to nausea and vomitting, found to have acute gastroenteritis, an AKI and a-fib with RVR. PMH including but not limited to PAF on Eliquis, vascular dementia, sundowning syndrome, HTN, HLD, multiple strokes in the past.   Clinical Impression   Brooke Pierce was seen for OT evaluation this date. Prior to hospital admission, pt was MOD I using RW. Pt lives alone at [place] [facility] [address] [facility]. Pt presents to acute OT demonstrating impaired ADL performance and functional mobility 2/2 decreased activity tolerance. Pt currently MOD I don/doff B socks in sitting. SBA + RW for simulated toilet t/f ~30 ft. Educated family on DME recs and d/c recs. Pt would benefit from skilled OT to address noted impairments and functional limitations (see below for any additional details). Upon hospital discharge, recommend OT follow up at facility and assist for bathing.     If plan is discharge home, recommend the following: A little help with bathing/dressing/bathroom;Help with stairs or ramp for entrance    Functional Status Assessment  Patient has had a recent decline in their functional status and demonstrates the ability to make significant improvements in function in a reasonable and predictable amount of time.  Equipment Recommendations  BSC/3in1    Recommendations for Other Services       Precautions / Restrictions Precautions Precautions: Fall Restrictions Weight Bearing Restrictions Per Provider Order: No      Mobility Bed Mobility               General bed mobility comments: not tested    Transfers Overall transfer level: Needs assistance Equipment used: Rolling walker (2 wheels) Transfers: Sit to/from Stand Sit to Stand: Supervision           General transfer comment: ~30 ft      Balance  Overall balance assessment: Needs assistance Sitting-balance support: Feet supported Sitting balance-Leahy Scale: Good     Standing balance support: During functional activity, Single extremity supported Standing balance-Leahy Scale: Fair                             ADL either performed or assessed with clinical judgement   ADL Overall ADL's : Needs assistance/impaired                                       General ADL Comments: MOD I don/doff B socks in sitting. SBA + RW for simulated toilet t/f ~30 ft      Pertinent Vitals/Pain Pain Assessment Pain Assessment: No/denies pain     Extremity/Trunk Assessment Upper Extremity Assessment Upper Extremity Assessment: Overall WFL for tasks assessed   Lower Extremity Assessment Lower Extremity Assessment: Generalized weakness   Cervical / Trunk Assessment Cervical / Trunk Assessment: Kyphotic   Communication Communication Communication: Hearing impairment Cueing Techniques: Verbal cues;Tactile cues;Visual cues   Cognition Arousal: Alert Behavior During Therapy: WFL for tasks assessed/performed Overall Cognitive Status: History of cognitive impairments - at baseline Area of Impairment: Memory, Following commands, Safety/judgement                     Memory: Decreased short-term memory, Decreased recall of precautions Following Commands: Follows one step commands with increased time Safety/Judgement: Decreased awareness of  deficits, Decreased awareness of safety                      Home Living Family/patient expects to be discharged to:: Private residence Living Arrangements: Alone   Type of Home: Independent living facility                       Home Equipment: Agricultural consultant (2 wheels)   Additional Comments: pt from [facility]      Prior Functioning/Environment Prior Level of Function : Needs assist             Mobility Comments: ambulates with  RW to dining hall ADLs Comments: assist for medication only, occassionally sponge bathes self        OT Problem List: Decreased strength;Decreased range of motion;Decreased activity tolerance      OT Treatment/Interventions: Therapeutic exercise;Self-care/ADL training;Energy conservation;DME and/or AE instruction;Therapeutic activities    OT Goals(Current goals can be found in the care plan section) Acute Rehab OT Goals Patient Stated Goal: to go home OT Goal Formulation: With patient/family Time For Goal Achievement: 04/10/23 Potential to Achieve Goals: Fair ADL Goals Pt Will Perform Grooming: with modified independence;standing Pt Will Perform Lower Body Dressing: with modified independence;sit to/from stand Pt Will Transfer to Toilet: with modified independence;regular height toilet  OT Frequency: Min 1X/week    Co-evaluation              AM-PAC OT "6 Clicks" Daily Activity     Outcome Measure Help from another person eating meals?: None Help from another person taking care of personal grooming?: A Little Help from another person toileting, which includes using toliet, bedpan, or urinal?: A Little Help from another person bathing (including washing, rinsing, drying)?: A Little Help from another person to put on and taking off regular upper body clothing?: None Help from another person to put on and taking off regular lower body clothing?: None 6 Click Score: 21   End of Session Equipment Utilized During Treatment: Rolling walker (2 wheels) Nurse Communication: Mobility status  Activity Tolerance: Patient tolerated treatment well Patient left: in chair;with call bell/phone within reach;with family/visitor present  OT Visit Diagnosis: Other abnormalities of gait and mobility (R26.89);Muscle weakness (generalized) (M62.81)                Time: 1010-1025 OT Time Calculation (min): 15 min Charges:  OT General Charges $OT Visit: 1 Visit OT Evaluation $OT Eval Low  Complexity: 1 Low  Kathie Dike, M.S. OTR/L  03/27/23, 10:36 AM  ascom [phone]

## 2023-03-27 NOTE — TOC Transition Note (Addendum)
Transition of Care [facility]) - Discharge Note   Patient Details  Name: Brooke Pierce MRN: [id number] Date of Birth: 1932-03-12  Transition of Care [facility]) CM/SW Contact:  Garret Reddish, RN Phone Number: 03/27/2023, 10:30 AM   Clinical Narrative:    Chart reviewed. Noted that patient will be a discharge for today.  I have spoken with Jill Alexanders with [facility] [address]  Jill Alexanders reports that patient is able to return to the facility today.  I have informed Jill Alexanders that patient will need Home Health PT at that facility.  Just reports that they use [facility] to provide Home Health PT/OT  I have informed patient's daughter Drinda Butts of the above information and she has no home health preference.  Drinda Butts will transport patient back to the facility today. I have faxed Jill Alexanders, Admission Coordinator at [facility] patient's Discharge Summary and Fl 2.    I have given home health referral to Velna Hatchet with [facility]  Iantha Fallen will provide home health PT/OT at the facility.    I have informed staff nurse of the above information.     Final next level of care: Assisted Living Barriers to Discharge: No Barriers Identified   Patient Goals and CMS Choice   CMS Medicare.gov Compare Post Acute Care list provided to:: Patient Choice offered to / list presented to : Patient      Discharge Placement              Patient chooses bed at:  (Patient is a resident of the [place] [facility]) Patient to be transferred to facility by: Patient's daughter Drinda Butts will transport patient to the facility todayl Name of family member notified: Frankey Shown Patient and family notified of of transfer: 03/27/23  Discharge Plan and Services Additional resources added to the After Visit Summary for                            [facility] Arranged: PT [facility] Agency: [facility] Date [facility] Agency Contacted: 03/27/23   Representative spoke with at [facility] Agency: Velna Hatchet  Social Drivers of Health  (SDOH) Interventions SDOH Screenings   Food Insecurity: No Food Insecurity (03/26/2023)  Housing: Low Risk  (03/26/2023)  Transportation Needs: No Transportation Needs (03/26/2023)  Utilities: Not At Risk (03/26/2023)  Financial Resource Strain: Low Risk  (08/14/2022)   Received from [facility]  Tobacco Use: Low Risk  (03/26/2023)     Readmission Risk Interventions     No data to display

## 2023-03-27 NOTE — Progress Notes (Signed)
Physical Therapy Evaluation Patient Details Name: Brooke Pierce MRN: [id number] DOB: [birth year] Today's Date: 03/27/2023  History of Present Illness  Pt is an 89 y/o female admitted secondary to nausea and vomitting, found to have acute gastroenteritis, an AKI and a-fib with RVR. PMH including but not limited to PAF on Eliquis, vascular dementia, sundowning syndrome, HTN, HLD, multiple strokes in the past.   Clinical Impression  Pt presented supine in bed with HOB elevated, awake and willing to participate in therapy session. Pt's daughter and sister present in room upon PT arrival as well. Prior to admission, pt was able to ambulate with use of a RW and required assistance from staff at ALF for ADLs/IADLs. Pt is from the [facility]. At the time of evaluation, pt able to complete bed mobility with CGA, transfers with min A and ambulated a short distance in her room with use of RW and CGA for safety. Pt would continue to benefit from skilled physical therapy services at this time while admitted and after d/c to address the below listed limitations in order to improve overall safety and independence with functional mobility.         If plan is discharge home, recommend the following: A little help with walking and/or transfers;A little help with bathing/dressing/bathroom;Assistance with cooking/housework;Supervision due to cognitive status;Help with stairs or ramp for entrance;Assist for transportation   Can travel by private vehicle   Yes    Equipment Recommendations None recommended by PT  Recommendations for Other Services       Functional Status Assessment Patient has had a recent decline in their functional status and demonstrates the ability to make significant improvements in function in a reasonable and predictable amount of time.     Precautions / Restrictions Precautions Precautions: Fall Restrictions Weight Bearing Restrictions Per Provider Order: No       Mobility  Bed Mobility Overal bed mobility: Needs Assistance Bed Mobility: Supine to Sit     Supine to sit: HOB elevated, Used rails, Contact guard     General bed mobility comments: increased time and effort needed    Transfers Overall transfer level: Needs assistance Equipment used: 1 person hand held assist, Rolling walker (2 wheels) Transfers: Sit to/from Stand, Bed to chair/wheelchair/BSC Sit to Stand: Min assist Stand pivot transfers: Min assist         General transfer comment: increased time and effort, pt initially standing from bed and completing bed to [facility] transfer with 1HHA and min A from PT for stability and safety. Pt then completing a sit-to-stand from [facility] with RW and min A    Ambulation/Gait Ambulation/Gait assistance: Contact guard assist Gait Distance (Feet): 15 Feet Assistive device: Rolling walker (2 wheels) Gait Pattern/deviations: Step-through pattern, Decreased stride length Gait velocity: decreased     General Gait Details: pt with slow, steady gait with RW within room; CGA for safety with maneuvering through narrow spaces  Stairs            Wheelchair Mobility     Tilt Bed    Modified Rankin (Stroke Patients Only)       Balance Overall balance assessment: Needs assistance Sitting-balance support: Feet supported Sitting balance-Leahy Scale: Fair     Standing balance support: During functional activity, Bilateral upper extremity supported, Single extremity supported Standing balance-Leahy Scale: Poor  Pertinent Vitals/Pain Pain Assessment Pain Assessment: No/denies pain    Home Living Family/patient expects to be discharged to:: Assisted living                 Home Equipment: Agricultural consultant (2 wheels) Additional Comments: [place] from [place] [facility] [place]    Prior Function Prior Level of Function : Needs assist             Mobility Comments: ambulates with RW ADLs  Comments: requires assistance from staff at ALF     Extremity/Trunk Assessment   Upper Extremity Assessment Upper Extremity Assessment: Defer to OT evaluation    Lower Extremity Assessment Lower Extremity Assessment: Generalized weakness    Cervical / Trunk Assessment Cervical / Trunk Assessment: Kyphotic  Communication   Communication Communication: Hearing impairment Cueing Techniques: Verbal cues;Tactile cues;Visual cues  Cognition Arousal: Alert Behavior During Therapy: Restless Overall Cognitive Status: History of cognitive impairments - at baseline Area of Impairment: Memory, Following commands, Safety/judgement, Awareness, Problem solving                     Memory: Decreased short-term memory, Decreased recall of precautions Following Commands: Follows one step commands with increased time Safety/Judgement: Decreased awareness of deficits, Decreased awareness of safety Awareness: Intellectual Problem Solving: Slow processing, Decreased initiation, Difficulty sequencing, Requires verbal cues          General Comments      Exercises     Assessment/Plan    PT Assessment Patient needs continued PT services  PT Problem List Decreased strength;Decreased range of motion;Decreased activity tolerance;Decreased mobility;Decreased balance;Decreased coordination;Decreased cognition;Decreased knowledge of use of DME;Decreased safety awareness;Decreased knowledge of precautions       PT Treatment Interventions DME instruction;Gait training;Stair training;Functional mobility training;Therapeutic activities;Therapeutic exercise;Balance training;Neuromuscular re-education;Patient/family education    PT Goals (Current goals can be found in the Care Plan section)  Acute Rehab PT Goals Patient Stated Goal: return to ALF PT Goal Formulation: With patient/family Time For Goal Achievement: 04/10/23 Potential to Achieve Goals: Good    Frequency Min 1X/week      Co-evaluation               AM-PAC PT "6 Clicks" Mobility  Outcome Measure Help needed turning from your back to your side while in a flat bed without using bedrails?: A Little Help needed moving from lying on your back to sitting on the side of a flat bed without using bedrails?: A Little Help needed moving to and from a bed to a chair (including a wheelchair)?: A Little Help needed standing up from a chair using your arms (e.g., wheelchair or bedside chair)?: A Little Help needed to walk in hospital room?: A Little Help needed climbing 3-5 steps with a railing? : A Lot 6 Click Score: 17    End of Session   Activity Tolerance: Patient tolerated treatment well Patient left: in chair;with call bell/phone within reach;with family/visitor present Nurse Communication: Mobility status PT Visit Diagnosis: Other abnormalities of gait and mobility (R26.89)    Time: 1610-9604 PT Time Calculation (min) (ACUTE ONLY): 22 min   Charges:   PT Evaluation $PT Eval Moderate Complexity: 1 Mod   PT General Charges $$ ACUTE PT VISIT: 1 Visit         Arletta Bale, DPT  Acute Rehabilitation Services Office [phone]   Alessandra Bevels Denham Mose 03/27/2023, 9:25 AM

## 2023-03-27 NOTE — [place] FL2 (Signed)
[place] MEDICAID FL2 LEVEL OF CARE FORM     IDENTIFICATION  Patient Name: Brooke Pierce Birthdate: Sep 28, 1931 Sex: female Admission Date (Current Location): 03/26/2023  [facility] and [place] Number:  Chiropodist and Address:  [facility], [address], [place], [place] [address]      Provider Number: [id number]  Attending Physician Name and Address:  Marrion Coy, MD  Relative Name and Phone Number:  Frankey Shown [phone]    Current Level of Care: Hospital Recommended Level of Care: Assisted Living Facility Prior Approval Number:    Date Approved/Denied:   PASRR Number:    Discharge Plan:  (Assisted Living)    Current Diagnoses: Patient Active Problem List   Diagnosis Date Noted   Hypokalemia 03/27/2023   Lactic acidosis 03/27/2023   Gastroenteritis 03/26/2023   Paroxysmal A-fib (HCC) 03/26/2023   AKI (acute kidney injury) (HCC) 03/26/2023   Acute CVA (cerebrovascular accident) (HCC) 09/14/2021   Hypertension 09/14/2021   Dementia (HCC) 09/14/2021    Orientation RESPIRATION BLADDER Height & Weight     Self, Time, Situation  Normal Incontinent Weight:   Height:     BEHAVIORAL SYMPTOMS/MOOD NEUROLOGICAL BOWEL NUTRITION STATUS      Incontinent  (See Discharge Summary)  AMBULATORY STATUS COMMUNICATION OF NEEDS Skin   Extensive Assist Verbally Normal                       Personal Care Assistance Level of Assistance  Bathing, Feeding, Dressing Bathing Assistance: Maximum assistance Feeding assistance: Limited assistance Dressing Assistance: Maximum assistance     Functional Limitations Info  Sight, Hearing, Speech Sight Info: Adequate Hearing Info: Adequate Speech Info: Adequate    SPECIAL CARE FACTORS FREQUENCY  PT (By licensed PT)     PT Frequency: 2-3 weekly              Contractures Contractures Info: Not present    Additional Factors Info  Code Status, Allergies Code Status Info: DNR-  Limited Allergies Info: Calcium-containing Compounds, Calcium-containing Compounds, Coreg (Carvedilol), Coreg (Carvedilol), Iodine           Current Medications (03/27/2023):  This is the current hospital active medication list Current Facility-Administered Medications  Medication Dose Route Frequency Provider Last Rate Last Admin   acetaminophen (TYLENOL) tablet 650 mg  650 mg Oral Q4H PRN Mikey College T, MD   650 mg at 03/26/23 2350   apixaban (ELIQUIS) tablet 5 mg  5 mg Oral BID Mikey College T, MD   5 mg at 03/27/23 0901   aspirin EC tablet 81 mg  81 mg Oral Daily Mikey College T, MD   81 mg at 03/27/23 0901   atorvastatin (LIPITOR) tablet 40 mg  40 mg Oral Daily Mikey College T, MD   40 mg at 03/27/23 0901   dorzolamide (TRUSOPT) 2 % ophthalmic solution 1 drop  1 drop Both Eyes BID Orson Aloe, [facility]   1 drop at 03/27/23 1027   And   timolol (TIMOPTIC) 0.5 % ophthalmic solution 1 drop  1 drop Both Eyes BID Orson Aloe, [facility]   1 drop at 03/27/23 2536   DULoxetine (CYMBALTA) DR capsule 30 mg  30 mg Oral Daily Mikey College T, MD   30 mg at 03/27/23 0901   DULoxetine (CYMBALTA) DR capsule 60 mg  60 mg Oral Daily Mikey College T, MD   60 mg at 03/27/23 0902   famotidine (PEPCID) tablet 20 mg  20  mg Oral Daily Mikey College T, MD   20 mg at 03/27/23 8119   lactobacillus (FLORANEX/LACTINEX) granules 1 g  1 g Oral TID Mikey College T, MD   1 g at 03/27/23 0902   metoprolol tartrate (LOPRESSOR) injection 5 mg  5 mg Intravenous Q4H PRN Mikey College T, MD       metoprolol tartrate (LOPRESSOR) tablet 50 mg  50 mg Oral BID Mikey College T, MD   50 mg at 03/27/23 0903   mirtazapine (REMERON) tablet 15 mg  15 mg Oral QHS Mikey College T, MD   15 mg at 03/26/23 2111   ondansetron (ZOFRAN) injection 4 mg  4 mg Intravenous Q6H PRN Emeline General, MD       polyvinyl alcohol (LIQUIFILM TEARS) 1.4 % ophthalmic solution 1 drop  1 drop Both Eyes QID Mikey College T, MD   1 drop at 03/27/23 1478   QUEtiapine  (SEROQUEL) tablet 25 mg  25 mg Oral Daily PRN Mikey College T, MD   25 mg at 03/27/23 0018   sodium bicarbonate tablet 650 mg  650 mg Oral BID Marrion Coy, MD         Discharge Medications:  TAKE these medications     acetaminophen 325 MG tablet Commonly known as: TYLENOL Take 650 mg by mouth every 4 (four) hours as needed for mild pain or fever.    aluminum-magnesium hydroxide-simethicone 200-200-20 MG/5ML Susp Commonly known as: MAALOX Take 30 mLs by mouth 4 (four) times daily as needed (heartburn or indigestion).    apixaban 5 MG Tabs tablet Commonly known as: ELIQUIS Take 1 tablet (5 mg total) by mouth 2 (two) times daily.    aspirin EC 81 MG tablet Take 1 tablet (81 mg total) by mouth daily.    atorvastatin 80 MG tablet Commonly known as: LIPITOR Take 80 mg by mouth at bedtime. What changed: Another medication with the same name was removed. Continue taking this medication, and follow the directions you see here.    D3 50 MCG (2000 UT) Tabs Generic drug: Cholecalciferol Take 1 tablet by mouth daily.    dorzolamide-timolol 2-0.5 % ophthalmic solution Commonly known as: COSOPT Place 1 drop into both eyes 2 (two) times daily.    DULoxetine 30 MG capsule Commonly known as: CYMBALTA Take 30 mg by mouth daily. Take along with one 60 mg capsule for total 90 mg once daily    DULoxetine 60 MG capsule Commonly known as: CYMBALTA Take 60 mg by mouth daily. Take along with one 30 mg capsule for total 90 mg once daily    famotidine 20 MG tablet Commonly known as: PEPCID Take 20 mg by mouth in the morning. What changed: Another medication with the same name was removed. Continue taking this medication, and follow the directions you see here.    guaiFENesin 100 MG/5ML liquid Commonly known as: ROBITUSSIN Take 15 mLs by mouth every 6 (six) hours as needed for cough or to loosen phlegm.    latanoprost 0.005 % ophthalmic solution Commonly known as: XALATAN Place 1 drop into  both eyes daily at 4 PM.    loperamide 2 MG tablet Commonly known as: IMODIUM A-D Take 4 mg by mouth 4 (four) times daily as needed for diarrhea or loose stools.    magnesium hydroxide 400 MG/5ML suspension Commonly known as: MILK OF MAGNESIA Take 30 mLs by mouth daily as needed for mild constipation or moderate constipation.    metoprolol tartrate 25 MG tablet Commonly  known as: LOPRESSOR Take 2 tablets (50 mg total) by mouth 2 (two) times daily.    mirtazapine 15 MG tablet Commonly known as: REMERON Take 15 mg by mouth at bedtime.    nitrofurantoin 50 MG capsule Commonly known as: MACRODANTIN Take 50 mg by mouth daily at 4 PM.    potassium chloride 10 MEQ tablet Commonly known as: KLOR-CON M Take 1 tablet (10 mEq total) by mouth 2 (two) times daily for 3 days.    QUEtiapine 25 MG tablet Commonly known as: SEROQUEL Take 25 mg by mouth daily as needed (agitation).    Refresh Plus 0.5 % Soln Generic drug: Carboxymethylcellulose Sod PF Place 1 drop into both eyes 4 (four) times daily.    Refresh Tears 0.5 % Soln Generic drug: carboxymethylcellulose Place 1 drop into both eyes in the morning, at noon, in the evening, and at bedtime.    sodium bicarbonate 650 MG tablet Take 1 tablet (650 mg total) by mouth 2 (two) times daily for 7 days.     Please see discharge summary for a list of discharge medications.    Relevant Imaging Results:  Relevant Lab Results:   Additional Information [id number]  Garret Reddish, RN

## 2023-03-27 NOTE — Progress Notes (Signed)
Patient discharging back to [facility], report called. DC paperwork sent with daughter to bring to facility. Patient leaving with daughter with all personal belongings via private vehicle

## 2023-03-27 NOTE — Discharge Summary (Signed)
Physician Discharge Summary   Patient: Brooke Pierce MRN: [id number] DOB: 1931-12-04  Admit date:     03/26/2023  Discharge date: 03/27/23  Discharge Physician: Marrion Coy   PCP: Bryson Corona, NP   Recommendations at discharge:   Follow-up with PCP in 1 week. Check a BMP in 1 week.  Discharge Diagnoses: Principal Problem:   AKI (acute kidney injury) (HCC) Active Problems:   Gastroenteritis   Paroxysmal A-fib (HCC)   Hypokalemia   Lactic acidosis  Resolved Problems:   * No resolved hospital problems. [facility] Course: Brooke Pierce is a 89 y.o. female with medical history significant of recently diagnosed PAF on Eliquis, vascular dementia, sundowning syndrome, HTN, HLD, multiple strokes in the past, sent from nursing home for evaluation of new onset of nauseous vomiting diarrhea.  Upon arriving to hospital, patient has a creatinine of 1.6 with a mild metabolic acidosis.  She received IV fluids, renal function has normalized.  She has developed mild hypokalemia of 3.4, is giving KCl 40 mEq x 1, will continue 20 mEq daily for 3 days.  Patient also be continued on sodium bicarb for 7 days for bicarb of 19.  Currently she is medically stable for discharge.  Assessment and Plan: Acute kidney injury secondary to nausea vomiting diarrhea. Acute gastroenteritis. Renal function has improved, symptom has resolved.  Hypokalemia. Mild metabolic acidosis. Repleted in the hospital, continue oral sodium bicarbonate and potassium.  Paroxysmal atrial fibrillation with RVR. Patient was taking metoprolol 50 mg twice a day, heart rate much better controlled.  Will continue the same dose.  SIRS. Lactic acidosis. No evidence of infection.  Lactic acidosis probably secondary to dehydration.  Essential hypertension. Resume metoprolol.  History of stroke. Continue anticoagulation with Eliquis.  Dementia with behavior disturbance. Patient was taking Seroquel, no longer has  any agitation.  CODE STATUS. Per daughter, patient is DO NOT RESUSCITATE status.  Chart updated.      Consultants: None Procedures performed: None  Disposition: Assisted living Diet recommendation:  Discharge Diet Orders (From admission, onward)     Start     Ordered   03/27/23 0000  Diet - low sodium heart healthy        03/27/23 1003           Cardiac diet DISCHARGE MEDICATION: Allergies as of 03/27/2023       Reactions   Calcium-containing Compounds Nausea And Vomiting   Calcium-containing Compounds    Coreg [carvedilol] Itching   Coreg [carvedilol]    Iodine         Medication List     STOP taking these medications    amLODipine 10 MG tablet Commonly known as: NORVASC   ondansetron 4 MG disintegrating tablet Commonly known as: ZOFRAN-ODT   Prolia 60 MG/ML Sosy injection Generic drug: denosumab   sodium chloride 1 g tablet       TAKE these medications    acetaminophen 325 MG tablet Commonly known as: TYLENOL Take 650 mg by mouth every 4 (four) hours as needed for mild pain or fever.   aluminum-magnesium hydroxide-simethicone 200-200-20 MG/5ML Susp Commonly known as: MAALOX Take 30 mLs by mouth 4 (four) times daily as needed (heartburn or indigestion).   apixaban 5 MG Tabs tablet Commonly known as: ELIQUIS Take 1 tablet (5 mg total) by mouth 2 (two) times daily.   aspirin EC 81 MG tablet Take 1 tablet (81 mg total) by mouth daily.   atorvastatin 80 MG tablet Commonly known as:  LIPITOR Take 80 mg by mouth at bedtime. What changed: Another medication with the same name was removed. Continue taking this medication, and follow the directions you see here.   D3 50 MCG (2000 UT) Tabs Generic drug: Cholecalciferol Take 1 tablet by mouth daily.   dorzolamide-timolol 2-0.5 % ophthalmic solution Commonly known as: COSOPT Place 1 drop into both eyes 2 (two) times daily.   DULoxetine 30 MG capsule Commonly known as: CYMBALTA Take 30 mg by  mouth daily. Take along with one 60 mg capsule for total 90 mg once daily   DULoxetine 60 MG capsule Commonly known as: CYMBALTA Take 60 mg by mouth daily. Take along with one 30 mg capsule for total 90 mg once daily   famotidine 20 MG tablet Commonly known as: PEPCID Take 20 mg by mouth in the morning. What changed: Another medication with the same name was removed. Continue taking this medication, and follow the directions you see here.   guaiFENesin 100 MG/5ML liquid Commonly known as: ROBITUSSIN Take 15 mLs by mouth every 6 (six) hours as needed for cough or to loosen phlegm.   latanoprost 0.005 % ophthalmic solution Commonly known as: XALATAN Place 1 drop into both eyes daily at 4 PM.   loperamide 2 MG tablet Commonly known as: IMODIUM A-D Take 4 mg by mouth 4 (four) times daily as needed for diarrhea or loose stools.   magnesium hydroxide 400 MG/5ML suspension Commonly known as: MILK OF MAGNESIA Take 30 mLs by mouth daily as needed for mild constipation or moderate constipation.   metoprolol tartrate 25 MG tablet Commonly known as: LOPRESSOR Take 2 tablets (50 mg total) by mouth 2 (two) times daily.   mirtazapine 15 MG tablet Commonly known as: REMERON Take 15 mg by mouth at bedtime.   nitrofurantoin 50 MG capsule Commonly known as: MACRODANTIN Take 50 mg by mouth daily at 4 PM.   potassium chloride 10 MEQ tablet Commonly known as: KLOR-CON M Take 1 tablet (10 mEq total) by mouth 2 (two) times daily for 3 days.   QUEtiapine 25 MG tablet Commonly known as: SEROQUEL Take 25 mg by mouth daily as needed (agitation).   Refresh Plus 0.5 % Soln Generic drug: Carboxymethylcellulose Sod PF Place 1 drop into both eyes 4 (four) times daily.   Refresh Tears 0.5 % Soln Generic drug: carboxymethylcellulose Place 1 drop into both eyes in the morning, at noon, in the evening, and at bedtime.   sodium bicarbonate 650 MG tablet Take 1 tablet (650 mg total) by mouth 2 (two)  times daily for 7 days.        Follow-up Information     Consepcion Hearing A, NP Follow up in 1 week(s).   Specialty: Internal Medicine Contact information: [address] [place] [address] [phone]                Discharge Exam: There were no vitals filed for this visit. General exam: Appears calm and comfortable  Respiratory system: Clear to auscultation. Respiratory effort normal. Cardiovascular system: Irregularly irregular. No JVD, murmurs, rubs, gallops or clicks. No pedal edema. Gastrointestinal system: Abdomen is nondistended, soft and nontender. No organomegaly or masses felt. Normal bowel sounds heard. Central nervous system: Alert and oriented x2. No focal neurological deficits. Extremities: Symmetric 5 x 5 power. Skin: No rashes, lesions or ulcers Psychiatry:  Mood & affect appropriate.    Condition at discharge: fair  The results of significant diagnostics from this hospitalization (including imaging, microbiology, ancillary  and laboratory) are listed below for reference.   Imaging Studies: CT ABDOMEN PELVIS WO CONTRAST Result Date: 03/26/2023 CLINICAL DATA:  89 year old female with vomiting and diarrhea. EXAM: CT ABDOMEN AND PELVIS WITHOUT CONTRAST TECHNIQUE: Multidetector CT imaging of the abdomen and pelvis was performed following the standard protocol without IV contrast. RADIATION DOSE REDUCTION: This exam was performed according to the departmental dose-optimization program which includes automated exposure control, adjustment of the mA and/or kV according to patient size and/or use of iterative reconstruction technique. COMPARISON:  CT lumbar spine 09/13/2021. FINDINGS: Lower chest: Cardiomegaly. Only trace pericardial fluid which is likely physiologic. Bilateral lung base mosaic attenuation which could be chronic lung disease or combined gas trapping and atelectasis. No pleural effusion or confluent lung base opacity. Hepatobiliary: Negative  noncontrast liver and gallbladder. Pancreas: Partially atrophied. Spleen: Diminutive, negative. Adrenals/Urinary Tract: Adrenal glands are within normal limits. Nonobstructed kidneys, small bilateral simple fluid density renal cysts (no follow-up imaging recommended). No nephrolithiasis. Some asymmetric left renal cortical scarring. Decompressed ureters. Decompressed bladder. Stomach/Bowel: Redundant but decompressed large bowel in the pelvis. Decompressed proximal sigmoid colon with diverticulosis, no active inflammation. Descending and transverse colon are decompressed. Right colon also relatively decompressed. Normal appendix series 3, image 52. No convincing large bowel inflammation. Decompressed terminal ileum. Fluid-filled but nondilated other small bowel loops in the abdomen and pelvis. Mild gas containing stomach. Duodenum is mostly decompressed. No pneumoperitoneum. No free fluid or convincing mesenteric inflammation. Vascular/Lymphatic: Normal caliber abdominal aorta. Aortoiliac calcified atherosclerosis. Vascular patency is not evaluated in the absence of IV contrast. No lymphadenopathy identified. Reproductive: Surgically absent uterus, diminutive or absent ovaries. Other: No pelvis free fluid. Musculoskeletal: No acute osseous abnormality identified. Previous proximal left femur ORIF. IMPRESSION: 1. Fluid-filled but nondilated small bowel loops raising the possibility of Enteritis. Normal appendix and no convincing large bowel inflammation. Mild sigmoid diverticulosis. 2. No other acute or inflammatory process identified in the noncontrast abdomen or pelvis. 3. Cardiomegaly,  Aortic Atherosclerosis (ICD10-I70.0). Electronically Signed   By: Odessa Fleming M.D.   On: 03/26/2023 07:57   DG Chest 1 View Result Date: 03/26/2023 CLINICAL DATA:  89 year old female with vomiting and diarrhea. EXAM: CHEST  1 VIEW COMPARISON:  Chest radiographs 02/23/2023. FINDINGS: Portable AP semi upright view at 0728 hours.  Mildly lower lung volumes. Stable cardiomegaly and mediastinal contours. Stable lung markings. No pneumothorax, pulmonary edema, pleural effusion or acute lung opacity. No acute osseous abnormality identified. Negative visible bowel gas. No evidence of pneumoperitoneum. IMPRESSION: Stable cardiomegaly. No acute cardiopulmonary abnormality. Electronically Signed   By: Odessa Fleming M.D.   On: 03/26/2023 07:53   CT Head Wo Contrast Result Date: 03/26/2023 CLINICAL DATA:  89 year old female with vomiting and diarrhea. EXAM: CT HEAD WITHOUT CONTRAST TECHNIQUE: Contiguous axial images were obtained from the base of the skull through the vertex without intravenous contrast. RADIATION DOSE REDUCTION: This exam was performed according to the departmental dose-optimization program which includes automated exposure control, adjustment of the mA and/or kV according to patient size and/or use of iterative reconstruction technique. COMPARISON:  Head CT 11/18/2021.  Brain MRI 09/13/2021. FINDINGS: Brain: Stable cerebral volume. Patchy and confluent scattered cerebral white matter, left basal ganglia hypodensity compatible with chronic small vessel disease and stable compared to last year. No midline shift, ventriculomegaly, mass effect, evidence of mass lesion, intracranial hemorrhage or evidence of cortically based acute infarction. Vascular: Extensive calcified atherosclerosis at the skull base. No suspicious intracranial vascular hyperdensity. Skull: Chronic bilateral TMJ degeneration. No acute osseous abnormality identified.  Sinuses/Orbits: Visualized paranasal sinuses and mastoids are well aerated. Other: Leftward gaze. Visualized scalp soft tissues are within normal limits. IMPRESSION: No acute intracranial abnormality. Stable non contrast CT appearance of chronic cerebral small vessel disease. Electronically Signed   By: Odessa Fleming M.D.   On: 03/26/2023 07:52    Microbiology: Results for orders placed or performed during  the hospital encounter of 03/26/23  Resp panel by RT-PCR (RSV, Flu A&B, Covid) Anterior Nasal Swab     Status: None   Collection Time: 03/26/23  7:28 AM   Specimen: Anterior Nasal Swab  Result Value Ref Range Status   SARS Coronavirus 2 by RT PCR NEGATIVE NEGATIVE Final    Comment: (NOTE) SARS-CoV-2 target nucleic acids are NOT DETECTED.  The SARS-CoV-2 RNA is generally detectable in upper respiratory specimens during the acute phase of infection. The lowest concentration of SARS-CoV-2 viral copies this assay can detect is 138 copies/mL. A negative result does not preclude SARS-Cov-2 infection and should not be used as the sole basis for treatment or other patient management decisions. A negative result may occur with  improper specimen collection/handling, submission of specimen other than nasopharyngeal swab, presence of viral mutation(s) within the areas targeted by this assay, and inadequate number of viral copies(<138 copies/mL). A negative result must be combined with clinical observations, patient history, and epidemiological information. The expected result is Negative.  Fact Sheet for Patients:  BloggerCourse.com  Fact Sheet for Healthcare Providers:  SeriousBroker.it  This test is no t yet approved or cleared by the [place] FDA and  has been authorized for detection and/or diagnosis of SARS-CoV-2 by FDA under an Emergency Use Authorization (EUA). This EUA will remain  in effect (meaning this test can be used) for the duration of the COVID-19 declaration under Section 564(b)(1) of the Act, 21 U.S.C.section 360bbb-3(b)(1), unless the authorization is terminated  or revoked sooner.       Influenza A by PCR NEGATIVE NEGATIVE Final   Influenza B by PCR NEGATIVE NEGATIVE Final    Comment: (NOTE) The Xpert Xpress SARS-CoV-2/FLU/RSV plus assay is intended as an aid in the diagnosis of influenza from Nasopharyngeal swab  specimens and should not be used as a sole basis for treatment. Nasal washings and aspirates are unacceptable for Xpert Xpress SARS-CoV-2/FLU/RSV testing.  Fact Sheet for Patients: BloggerCourse.com  Fact Sheet for Healthcare Providers: SeriousBroker.it  This test is not yet approved or cleared by the [place] FDA and has been authorized for detection and/or diagnosis of SARS-CoV-2 by FDA under an Emergency Use Authorization (EUA). This EUA will remain in effect (meaning this test can be used) for the duration of the COVID-19 declaration under Section 564(b)(1) of the Act, 21 U.S.C. section 360bbb-3(b)(1), unless the authorization is terminated or revoked.     Resp Syncytial Virus by PCR NEGATIVE NEGATIVE Final    Comment: (NOTE) Fact Sheet for Patients: BloggerCourse.com  Fact Sheet for Healthcare Providers: SeriousBroker.it  This test is not yet approved or cleared by the [place] FDA and has been authorized for detection and/or diagnosis of SARS-CoV-2 by FDA under an Emergency Use Authorization (EUA). This EUA will remain in effect (meaning this test can be used) for the duration of the COVID-19 declaration under Section 564(b)(1) of the Act, 21 U.S.C. section 360bbb-3(b)(1), unless the authorization is terminated or revoked.  Performed at [facility], [address] [place], [place] [address]     Labs: CBC: Recent Labs  Lab 03/26/23 [id number] 03/27/23 [phone]  WBC 14.6* 4.2  HGB 14.2 10.8*  HCT 43.7 33.4*  MCV 86.4 84.8  PLT 241 155   Basic Metabolic Panel: Recent Labs  Lab 03/26/23 0629 03/26/23 0706 03/27/23 0517  NA 139  --  136  K 3.9  --  3.4*  CL 103  --  108  CO2 19*  --  19*  GLUCOSE 177*  --  102*  BUN 38*  --  20  CREATININE 1.60*  --  0.98  CALCIUM 9.0  --  7.5*  MG  --  2.2  --    Liver Function Tests: Recent Labs  Lab  03/26/23 0629  AST 23  ALT 19  ALKPHOS 53  BILITOT 1.0  PROT 7.1  ALBUMIN 4.1   CBG: No results for input(s): "GLUCAP" in the last 168 hours.  Discharge time spent: greater than 30 minutes.  Signed: Marrion Coy, MD [facility] 03/27/2023

## 2023-03-31 ENCOUNTER — Emergency Department: Payer: Medicare PPO

## 2023-03-31 ENCOUNTER — Observation Stay
Admission: EM | Admit: 2023-03-31 | Discharge: 2023-04-01 | Disposition: A | Discharge status: Skilled Nursing Facility | Payer: Medicare PPO | Attending: Internal Medicine | Admitting: Internal Medicine

## 2023-03-31 ENCOUNTER — Other Ambulatory Visit: Payer: Self-pay

## 2023-03-31 DIAGNOSIS — Z1152 Encounter for screening for COVID-19: Secondary | ICD-10-CM | POA: Insufficient documentation

## 2023-03-31 DIAGNOSIS — N390 Urinary tract infection, site not specified: Secondary | ICD-10-CM | POA: Diagnosis not present

## 2023-03-31 DIAGNOSIS — R531 Weakness: Secondary | ICD-10-CM | POA: Diagnosis present

## 2023-03-31 DIAGNOSIS — I130 Hypertensive heart and chronic kidney disease with heart failure and stage 1 through stage 4 chronic kidney disease, or unspecified chronic kidney disease: Secondary | ICD-10-CM | POA: Insufficient documentation

## 2023-03-31 DIAGNOSIS — Z79899 Other long term (current) drug therapy: Secondary | ICD-10-CM | POA: Diagnosis not present

## 2023-03-31 DIAGNOSIS — E785 Hyperlipidemia, unspecified: Secondary | ICD-10-CM | POA: Diagnosis present

## 2023-03-31 DIAGNOSIS — N1831 Chronic kidney disease, stage 3a: Secondary | ICD-10-CM | POA: Insufficient documentation

## 2023-03-31 DIAGNOSIS — F0390 Unspecified dementia without behavioral disturbance: Secondary | ICD-10-CM | POA: Diagnosis present

## 2023-03-31 DIAGNOSIS — Z7901 Long term (current) use of anticoagulants: Secondary | ICD-10-CM | POA: Insufficient documentation

## 2023-03-31 DIAGNOSIS — I4891 Unspecified atrial fibrillation: Secondary | ICD-10-CM | POA: Diagnosis present

## 2023-03-31 DIAGNOSIS — K5730 Diverticulosis of large intestine without perforation or abscess without bleeding: Secondary | ICD-10-CM | POA: Insufficient documentation

## 2023-03-31 DIAGNOSIS — I480 Paroxysmal atrial fibrillation: Principal | ICD-10-CM | POA: Insufficient documentation

## 2023-03-31 DIAGNOSIS — N3000 Acute cystitis without hematuria: Secondary | ICD-10-CM | POA: Diagnosis not present

## 2023-03-31 DIAGNOSIS — R2681 Unsteadiness on feet: Secondary | ICD-10-CM | POA: Diagnosis not present

## 2023-03-31 DIAGNOSIS — R4182 Altered mental status, unspecified: Secondary | ICD-10-CM | POA: Insufficient documentation

## 2023-03-31 DIAGNOSIS — I5032 Chronic diastolic (congestive) heart failure: Secondary | ICD-10-CM | POA: Diagnosis present

## 2023-03-31 DIAGNOSIS — I10 Essential (primary) hypertension: Secondary | ICD-10-CM | POA: Diagnosis not present

## 2023-03-31 DIAGNOSIS — Z8673 Personal history of transient ischemic attack (TIA), and cerebral infarction without residual deficits: Secondary | ICD-10-CM | POA: Insufficient documentation

## 2023-03-31 DIAGNOSIS — G9341 Metabolic encephalopathy: Secondary | ICD-10-CM | POA: Diagnosis not present

## 2023-03-31 DIAGNOSIS — I639 Cerebral infarction, unspecified: Secondary | ICD-10-CM | POA: Diagnosis present

## 2023-03-31 DIAGNOSIS — F418 Other specified anxiety disorders: Secondary | ICD-10-CM | POA: Diagnosis present

## 2023-03-31 LAB — LIPASE, BLOOD: Lipase: 36 U/L (ref 11–51)

## 2023-03-31 LAB — CBC
HCT: 41.8 % (ref 36.0–46.0)
Hemoglobin: 13.7 g/dL (ref 12.0–15.0)
MCH: 27.4 pg (ref 26.0–34.0)
MCHC: 32.8 g/dL (ref 30.0–36.0)
MCV: 83.6 fL (ref 80.0–100.0)
Platelets: 261 10*3/uL (ref 150–400)
RBC: 5 MIL/uL (ref 3.87–5.11)
RDW: 13.2 % (ref 11.5–15.5)
WBC: 8.7 10*3/uL (ref 4.0–10.5)
nRBC: 0 % (ref 0.0–0.2)

## 2023-03-31 LAB — COMPREHENSIVE METABOLIC PANEL
ALT: 21 U/L (ref 0–44)
AST: 22 U/L (ref 15–41)
Albumin: 4.1 g/dL (ref 3.5–5.0)
Alkaline Phosphatase: 52 U/L (ref 38–126)
Anion gap: 15 (ref 5–15)
BUN: 26 mg/dL — ABNORMAL HIGH (ref 8–23)
CO2: 23 mmol/L (ref 22–32)
Calcium: 9.3 mg/dL (ref 8.9–10.3)
Chloride: 100 mmol/L (ref 98–111)
Creatinine, Ser: 1.74 mg/dL — ABNORMAL HIGH (ref 0.44–1.00)
GFR, Estimated: 28 mL/min — ABNORMAL LOW (ref >60)
Glucose, Bld: 115 mg/dL — ABNORMAL HIGH (ref 70–99)
Potassium: 4.2 mmol/L (ref 3.5–5.1)
Sodium: 138 mmol/L (ref 135–145)
Total Bilirubin: 0.9 mg/dL (ref 0.0–1.2)
Total Protein: 7.3 g/dL (ref 6.5–8.1)

## 2023-03-31 LAB — URINALYSIS, ROUTINE W REFLEX MICROSCOPIC
Bilirubin Urine: NEGATIVE
Glucose, UA: NEGATIVE mg/dL
Ketones, ur: NEGATIVE mg/dL
Nitrite: POSITIVE — AB
Protein, ur: 100 mg/dL — AB
Specific Gravity, Urine: 1.019 (ref 1.005–1.030)
Squamous Epithelial / HPF: 0 /[HPF] (ref 0–5)
WBC, UA: >50 WBC/hpf (ref 0–5)
pH: 5 (ref 5.0–8.0)

## 2023-03-31 LAB — TSH: TSH: 1.167 u[IU]/mL (ref 0.350–4.500)

## 2023-03-31 LAB — TROPONIN I (HIGH SENSITIVITY)
Troponin I (High Sensitivity): 13 ng/L (ref <18)
Troponin I (High Sensitivity): 13 ng/L (ref <18)

## 2023-03-31 LAB — MAGNESIUM: Magnesium: 2.4 mg/dL (ref 1.7–2.4)

## 2023-03-31 LAB — BRAIN NATRIURETIC PEPTIDE: B Natriuretic Peptide: 351.3 pg/mL — ABNORMAL HIGH (ref 0.0–100.0)

## 2023-03-31 LAB — RESP PANEL BY RT-PCR (RSV, FLU A&B, COVID)  RVPGX2
Influenza A by PCR: NEGATIVE
Influenza B by PCR: NEGATIVE
Resp Syncytial Virus by PCR: NEGATIVE
SARS Coronavirus 2 by RT PCR: NEGATIVE

## 2023-03-31 MED ORDER — VITAMIN D 25 MCG (1000 UNIT) PO TABS
2000.0000 [IU] | ORAL_TABLET | Freq: Every day | ORAL | Status: DC
  Administered 2023-04-01: 2000 [IU] via ORAL
  Filled 2023-03-31: qty 2

## 2023-03-31 MED ORDER — METOPROLOL TARTRATE 50 MG PO TABS: 50.0000 mg | ORAL_TABLET | Freq: Two times a day (BID) | ORAL | Status: DC

## 2023-03-31 MED ORDER — APIXABAN 5 MG PO TABS: 5.0000 mg | ORAL_TABLET | Freq: Two times a day (BID) | ORAL | Status: DC

## 2023-03-31 MED ORDER — CLOPIDOGREL BISULFATE 75 MG PO TABS
75.0000 mg | ORAL_TABLET | Freq: Every day | ORAL | Status: DC
  Administered 2023-04-01: 75 mg via ORAL
  Filled 2023-03-31: qty 1

## 2023-03-31 MED ORDER — ACETAMINOPHEN 325 MG PO TABS: 650.0000 mg | ORAL_TABLET | Freq: Four times a day (QID) | ORAL | Status: DC | PRN

## 2023-03-31 MED ORDER — HYDRALAZINE HCL 20 MG/ML IJ SOLN: 5.0000 mg | INTRAMUSCULAR | Status: DC | PRN

## 2023-03-31 MED ORDER — SODIUM BICARBONATE 650 MG PO TABS
650.0000 mg | ORAL_TABLET | Freq: Two times a day (BID) | ORAL | Status: DC
  Administered 2023-04-01 (×2): 650 mg via ORAL
  Filled 2023-03-31 (×2): qty 1

## 2023-03-31 MED ORDER — DULOXETINE HCL 60 MG PO CPEP: 60.0000 mg | ORAL_CAPSULE | Freq: Every day | ORAL | Status: DC

## 2023-03-31 MED ORDER — DILTIAZEM HCL-DEXTROSE 125-5 MG/125ML-% IV SOLN (PREMIX)
5.0000 mg/h | INTRAVENOUS | Status: DC
  Administered 2023-03-31: 5 mg/h via INTRAVENOUS
  Filled 2023-03-31: qty 125

## 2023-03-31 MED ORDER — DULOXETINE HCL 30 MG PO CPEP
90.0000 mg | ORAL_CAPSULE | Freq: Every day | ORAL | Status: DC
  Administered 2023-04-01 (×2): 90 mg via ORAL
  Filled 2023-03-31 (×2): qty 3

## 2023-03-31 MED ORDER — DORZOLAMIDE HCL-TIMOLOL MAL 2-0.5 % OP SOLN
1.0000 [drp] | Freq: Two times a day (BID) | OPHTHALMIC | Status: DC
  Filled 2023-03-31: qty 10

## 2023-03-31 MED ORDER — LATANOPROST 0.005 % OP SOLN
1.0000 [drp] | Freq: Every day | OPHTHALMIC | Status: DC
  Filled 2023-03-31: qty 2.5

## 2023-03-31 MED ORDER — SODIUM CHLORIDE 0.9 % IV SOLN: INTRAVENOUS | Status: DC

## 2023-03-31 MED ORDER — LOPERAMIDE HCL 2 MG PO CAPS: 4.0000 mg | ORAL_CAPSULE | Freq: Four times a day (QID) | ORAL | Status: DC | PRN

## 2023-03-31 MED ORDER — MIRTAZAPINE 15 MG PO TABS
15.0000 mg | ORAL_TABLET | Freq: Every day | ORAL | Status: DC
  Administered 2023-04-01: 15 mg via ORAL
  Filled 2023-03-31: qty 1

## 2023-03-31 MED ORDER — ATORVASTATIN CALCIUM 20 MG PO TABS
80.0000 mg | ORAL_TABLET | Freq: Every day | ORAL | Status: DC
  Administered 2023-04-01: 80 mg via ORAL
  Filled 2023-03-31: qty 4

## 2023-03-31 MED ORDER — SODIUM CHLORIDE 0.9 % IV SOLN: 1.0000 g | INTRAVENOUS | Status: DC

## 2023-03-31 MED ORDER — METOPROLOL TARTRATE 50 MG PO TABS
50.0000 mg | ORAL_TABLET | Freq: Once | ORAL | Status: AC
  Administered 2023-03-31: 50 mg via ORAL
  Filled 2023-03-31: qty 1

## 2023-03-31 MED ORDER — FAMOTIDINE 20 MG PO TABS
20.0000 mg | ORAL_TABLET | Freq: Every morning | ORAL | Status: DC
  Administered 2023-04-01: 20 mg via ORAL

## 2023-03-31 MED ORDER — POLYVINYL ALCOHOL 1.4 % OP SOLN
1.0000 [drp] | Freq: Four times a day (QID) | OPHTHALMIC | Status: DC
  Filled 2023-03-31: qty 15

## 2023-03-31 MED ORDER — QUETIAPINE FUMARATE 25 MG PO TABS
50.0000 mg | ORAL_TABLET | Freq: Every day | ORAL | Status: DC
Start: 2023-04-01 — End: 2023-04-01
  Administered 2023-04-01: 50 mg via ORAL
  Filled 2023-03-31: qty 2

## 2023-03-31 MED ORDER — ZOLPIDEM TARTRATE 5 MG PO TABS
5.0000 mg | ORAL_TABLET | Freq: Every evening | ORAL | Status: DC | PRN
  Administered 2023-04-01: 5 mg via ORAL
  Filled 2023-03-31: qty 1

## 2023-03-31 MED ORDER — ONDANSETRON HCL 4 MG/2ML IJ SOLN: 4.0000 mg | Freq: Three times a day (TID) | INTRAMUSCULAR | Status: DC | PRN

## 2023-03-31 MED ORDER — SODIUM CHLORIDE 0.9 % IV SOLN
1.0000 g | Freq: Once | INTRAVENOUS | Status: AC
  Administered 2023-03-31: 1 g via INTRAVENOUS
  Filled 2023-03-31: qty 10

## 2023-03-31 NOTE — ED Notes (Signed)
Dr Marisa Severin in with pt.

## 2023-03-31 NOTE — ED Provider Triage Note (Signed)
Emergency Medicine Provider Triage Evaluation Note  Brooke Pierce , a 89 y.o. female  was evaluated in triage.  Pt complains of epigastric pain.  Patient's family states that she has dementia but is not at her baseline and seems to stare a lot.  Family states that patient was admitted for a gastroenteritis which resolved.  Patient resides at the [place] of [address] and patient's family is not certain about p.o. intake since returning.  Currently denies pain.  Review of Systems  Positive: Upper abdominal pain, questionable chest pain, questionable altered mental status, history of CVA. Negative: No known recent nausea, vomiting or diarrhea.  Physical Exam  BP 134/61 (BP Location: Right Arm)   Pulse 61   Temp 97.7 F (36.5 C) (Oral)   Resp 17   Ht 5\' 5"  (1.651 m)   Wt 81 kg   SpO2 95%   BMI 29.72 kg/m  Gen:   Awake, no distress poor historian, dementia, Resp:  Normal effort  MSK:   Moves extremities without difficulty  Other:    Medical Decision Making  Medically screening exam initiated at 11:31 AM.  Appropriate orders placed.  MADDYN SHIRD was informed that the remainder of the evaluation will be completed by another provider, this initial triage assessment does not replace that evaluation, and the importance of remaining in the ED until their evaluation is complete.     Tommi Rumps, PA-C 03/31/23 [phone]

## 2023-03-31 NOTE — ED Triage Notes (Signed)
Pt presents to ED from cardiology office for concerns of AMS, daughter in law states dementia at baseline but cardiology office states pt keeps yelling in pain.   Pt from [facility] [address]. Family with pt states that pt did not complain of BP cuff pain but is now, family states HX of stroke.

## 2023-03-31 NOTE — H&P (Addendum)
History and Physical    [name] XBM:[id number] DOB: [birth year] DOA: 03/31/2023  Referring MD/NP/PA:   PCP: Bryson Corona, NP   Patient coming from:  The patient is coming from ALF   Chief Complaint: Weakness, confusion  HPI: Brooke Pierce is a 89 y.o. female with medical history significant of HTN, HDL, dCHF, stroke, depression with anxiety, dementia, A-fib on Eliquis, RLS, hard of hearing, carotid artery stenosis, CKD-3A, who presents with weakness and confusion.  Patient has a history of dementia and is more confused than baseline, cannot provide accurate medical history. Pt was recently hospitalized from 12/25 - 12/26 due to gastroenteritis and AKI. Per her daughter at bedside, today patient she in cardiologist office for follow-up visit, and was found to be more confused.  At her normal baseline, patient recognizes her daughter, usually oriented to place, but not oriented to time.  Today patient's more lethargic, less talkative, weaker, not oriented to the place and time.  No unilateral weakness.  Per her daughter, patient seems to have pain in either epigastric or lower chest earlier area, which has resolved.  Currently no chest pain, cough, SOB.  No nausea, vomiting, diarrhea or abdominal pain today.  Not sure if patient has symptoms of UTI. Patient was found to have A-fib with RVR, with heart rate up to 135 in ED  Data reviewed independently and ED Course: pt was found to have WBC 8.7, positive urinalysis (turbid appearance, large amount of leukocyte, positive nitrite, rare bacteria, WBC > 50), negative PCR for COVID, flu and RSV, worsening renal function (with creatinine 1.74, BUN 26, GFR 28, recent baseline creatinine 0.98 on 03/27/2023), troponin 13 --> 13.  Temperature normal, blood pressure 135/79, RR 25, oxygen saturation 98% on room air.  Chest x-ray negative.  CT of head is negative for acute intracranial abnormalities.  Patient is placed on PCU for  observation.   EKG: I have personally reviewed.  First EKG showed A-fib, LAD, poor R wave progression.  The repeated EKG showed A-fib with PVC, heart rate 131.   Review of Systems: Could not be reviewed accurately due to dementia and altered mental status.  Allergy:  Allergies  Allergen Reactions   Calcium-Containing Compounds Nausea And Vomiting   Calcium-Containing Compounds    Coreg [Carvedilol] Itching   Coreg [Carvedilol]    Iodine     Past Medical History:  Diagnosis Date   Anxiety    Carotid atherosclerosis    Cataract    Depression    Glaucoma    HTN (hypertension)    Hypertension    Insomnia    Osteoporosis    Restless leg syndrome    Sensorineural hearing loss    Stroke [facility])    Vascular dementia (HCC)     History reviewed. No pertinent surgical history.  Social History:  reports that she has never smoked. She has never used smokeless tobacco. She reports that she does not currently use alcohol. She reports that she does not use drugs.  Family History:  Family History  Problem Relation Age of Onset   Dementia Brother      Prior to Admission medications   Medication Sig Start Date End Date Taking? Authorizing Provider  acetaminophen (TYLENOL) 325 MG tablet Take 650 mg by mouth every 4 (four) hours as needed for mild pain or fever.    [provider]  aluminum-magnesium hydroxide-simethicone (MAALOX) 200-200-20 MG/5ML SUSP Take 30 mLs by mouth 4 (four) times daily as needed (heartburn or indigestion).  [provider]  apixaban (ELIQUIS) 5 MG TABS tablet Take 1 tablet (5 mg total) by mouth 2 (two) times daily. 02/23/23 03/25/23  Willy Eddy, MD  aspirin EC 81 MG tablet Take 1 tablet (81 mg total) by mouth daily. 03/27/23   Marrion Coy, MD  atorvastatin (LIPITOR) 80 MG tablet Take 80 mg by mouth at bedtime.    [provider]  Carboxymethylcellulose Sod PF (REFRESH PLUS) 0.5 % SOLN Place 1 drop into both eyes 4 (four)  times daily.    [provider]  D3 50 MCG (2000 UT) TABS Take 1 tablet by mouth daily. 08/22/21   [provider]  dorzolamide-timolol (COSOPT) 2-0.5 % ophthalmic solution Place 1 drop into both eyes 2 (two) times daily.    [provider]  DULoxetine (CYMBALTA) 30 MG capsule Take 30 mg by mouth daily. Take along with one 60 mg capsule for total 90 mg once daily 08/22/21   [provider]  DULoxetine (CYMBALTA) 60 MG capsule Take 60 mg by mouth daily. Take along with one 30 mg capsule for total 90 mg once daily 08/22/21   [provider]  famotidine (PEPCID) 20 MG tablet Take 20 mg by mouth in the morning.    [provider]  guaiFENesin (ROBITUSSIN) 100 MG/5ML liquid Take 15 mLs by mouth every 6 (six) hours as needed for cough or to loosen phlegm.    [provider]  latanoprost (XALATAN) 0.005 % ophthalmic solution Place 1 drop into both eyes daily at 4 PM.    [provider]  loperamide (IMODIUM A-D) 2 MG tablet Take 4 mg by mouth 4 (four) times daily as needed for diarrhea or loose stools.    [provider]  magnesium hydroxide (MILK OF MAGNESIA) 400 MG/5ML suspension Take 30 mLs by mouth daily as needed for mild constipation or moderate constipation.    [provider]  metoprolol tartrate (LOPRESSOR) 25 MG tablet Take 2 tablets (50 mg total) by mouth 2 (two) times daily. 03/27/23 03/26/24  Marrion Coy, MD  mirtazapine (REMERON) 15 MG tablet Take 15 mg by mouth at bedtime.    [provider]  nitrofurantoin (MACRODANTIN) 50 MG capsule Take 50 mg by mouth daily at 4 PM.    [provider]  potassium chloride (KLOR-CON M) 10 MEQ tablet Take 1 tablet (10 mEq total) by mouth 2 (two) times daily for 3 days. 03/27/23 03/30/23  Marrion Coy, MD  QUEtiapine (SEROQUEL) 25 MG tablet Take 25 mg by mouth daily as needed (agitation).    [provider]  REFRESH TEARS 0.5 % SOLN Place 1 drop  into both eyes in the morning, at noon, in the evening, and at bedtime. 08/31/21   [provider]  sodium bicarbonate 650 MG tablet Take 1 tablet (650 mg total) by mouth 2 (two) times daily for 7 days. 03/27/23 04/03/23  Marrion Coy, MD    Physical Exam: Vitals:   03/31/23 2000 03/31/23 2100 03/31/23 2134 03/31/23 2200  BP: 123/86 124/88  126/89  Pulse: (!) 130 (!) 108  63  Resp: 19 17  (!) 21  Temp:   98 F (36.7 C)   TempSrc:   Oral   SpO2: 97% 98%  93%  Weight:      Height:       General: Not in acute distress HEENT:       Eyes: PERRL, EOMI, no jaundice       ENT: No discharge from the  ears and nose       Neck: No JVD, no bruit, no mass felt. Heme: No neck lymph node enlargement. Cardiac: S1/S2, irregularly irregular rhythm, no murmurs, No gallops or rubs. Respiratory: No rales, wheezing, rhonchi or rubs. GI: Soft, nondistended, nontender, no organomegaly, BS present. GU: No hematuria Ext: No pitting leg edema bilaterally. 1+DP/PT pulse bilaterally. Musculoskeletal: No joint deformities, No joint redness or warmth, no limitation of ROM in spin. Skin: No rashes.  Neuro: Confused, knows her own name, not orientated to place and time. Cranial nerves II-XII grossly intact, moves all extremities Psych: Patient is not psychotic, no suicidal or hemocidal ideation.  Labs on Admission: I have personally reviewed following labs and imaging studies  CBC: Recent Labs  Lab 03/26/23 0629 03/27/23 0517 03/31/23 1132  WBC 14.6* 4.2 8.7  HGB 14.2 10.8* 13.7  HCT 43.7 33.4* 41.8  MCV 86.4 84.8 83.6  PLT 241 155 261   Basic Metabolic Panel: Recent Labs  Lab 03/26/23 0629 03/26/23 0706 03/27/23 0517 03/31/23 1745  NA 139  --  136 138  K 3.9  --  3.4* 4.2  CL 103  --  108 100  CO2 19*  --  19* 23  GLUCOSE 177*  --  102* 115*  BUN 38*  --  20 26*  CREATININE 1.60*  --  0.98 1.74*  CALCIUM 9.0  --  7.5* 9.3  MG  --  2.2  --  2.4   GFR: Estimated Creatinine  Clearance: 23 mL/min (A) (by C-G formula based on SCr of 1.74 mg/dL (H)). Liver Function Tests: Recent Labs  Lab 03/26/23 0629 03/31/23 1745  AST 23 22  ALT 19 21  ALKPHOS 53 52  BILITOT 1.0 0.9  PROT 7.1 7.3  ALBUMIN 4.1 4.1   Recent Labs  Lab 03/26/23 0629 03/31/23 1132  LIPASE 26 36   No results for input(s): "AMMONIA" in the last 168 hours. Coagulation Profile: No results for input(s): "INR", "PROTIME" in the last 168 hours. Cardiac Enzymes: No results for input(s): "CKTOTAL", "CKMB", "CKMBINDEX", "TROPONINI" in the last 168 hours. BNP (last 3 results) No results for input(s): "PROBNP" in the last 8760 hours. HbA1C: No results for input(s): "HGBA1C" in the last 72 hours. CBG: No results for input(s): "GLUCAP" in the last 168 hours. Lipid Profile: No results for input(s): "CHOL", "HDL", "LDLCALC", "TRIG", "CHOLHDL", "LDLDIRECT" in the last 72 hours. Thyroid Function Tests: Recent Labs    03/31/23 1745  TSH 1.167   Anemia Panel: No results for input(s): "VITAMINB12", "FOLATE", "FERRITIN", "TIBC", "IRON", "RETICCTPCT" in the last 72 hours. Urine analysis:    Component Value Date/Time   COLORURINE AMBER (A) 03/31/2023 1820   APPEARANCEUR TURBID (A) 03/31/2023 1820   LABSPEC 1.019 03/31/2023 1820   PHURINE 5.0 03/31/2023 1820   GLUCOSEU NEGATIVE 03/31/2023 1820   HGBUR SMALL (A) 03/31/2023 1820   BILIRUBINUR NEGATIVE 03/31/2023 1820   KETONESUR NEGATIVE 03/31/2023 1820   PROTEINUR 100 (A) 03/31/2023 1820   NITRITE POSITIVE (A) 03/31/2023 1820   LEUKOCYTESUR LARGE (A) 03/31/2023 1820   Sepsis Labs: @LABRCNTIP (procalcitonin:4,lacticidven:4) ) Recent Results (from the past 240 hours)  Resp panel by RT-PCR (RSV, Flu A&B, Covid) Anterior Nasal Swab     Status: None   Collection Time: 03/26/23  7:28 AM   Specimen: Anterior Nasal Swab  Result Value Ref Range Status   SARS Coronavirus 2 by RT PCR NEGATIVE NEGATIVE Final    Comment: (NOTE) SARS-CoV-2 target  nucleic acids are NOT DETECTED.  The SARS-CoV-2 RNA is generally detectable in upper respiratory specimens during the acute phase of infection. The lowest concentration of SARS-CoV-2 viral copies this assay can detect is 138 copies/mL. A negative result does not preclude SARS-Cov-2 infection and should not be used as the sole basis for treatment or other patient management decisions. A negative result may occur with  improper specimen collection/handling, submission of specimen other than nasopharyngeal swab, presence of viral mutation(s) within the areas targeted by this assay, and inadequate number of viral copies(<138 copies/mL). A negative result must be combined with clinical observations, patient history, and epidemiological information. The expected result is Negative.  Fact Sheet for Patients:  BloggerCourse.com  Fact Sheet for Healthcare Providers:  SeriousBroker.it  This test is no t yet approved or cleared by the [place] FDA and  has been authorized for detection and/or diagnosis of SARS-CoV-2 by FDA under an Emergency Use Authorization (EUA). This EUA will remain  in effect (meaning this test can be used) for the duration of the COVID-19 declaration under Section 564(b)(1) of the Act, 21 U.S.C.section 360bbb-3(b)(1), unless the authorization is terminated  or revoked sooner.       Influenza A by PCR NEGATIVE NEGATIVE Final   Influenza B by PCR NEGATIVE NEGATIVE Final    Comment: (NOTE) The Xpert Xpress SARS-CoV-2/FLU/RSV plus assay is intended as an aid in the diagnosis of influenza from Nasopharyngeal swab specimens and should not be used as a sole basis for treatment. Nasal washings and aspirates are unacceptable for Xpert Xpress SARS-CoV-2/FLU/RSV testing.  Fact Sheet for Patients: BloggerCourse.com  Fact Sheet for Healthcare  Providers: SeriousBroker.it  This test is not yet approved or cleared by the [place] FDA and has been authorized for detection and/or diagnosis of SARS-CoV-2 by FDA under an Emergency Use Authorization (EUA). This EUA will remain in effect (meaning this test can be used) for the duration of the COVID-19 declaration under Section 564(b)(1) of the Act, 21 U.S.C. section 360bbb-3(b)(1), unless the authorization is terminated or revoked.     Resp Syncytial Virus by PCR NEGATIVE NEGATIVE Final    Comment: (NOTE) Fact Sheet for Patients: BloggerCourse.com  Fact Sheet for Healthcare Providers: SeriousBroker.it  This test is not yet approved or cleared by the [place] FDA and has been authorized for detection and/or diagnosis of SARS-CoV-2 by FDA under an Emergency Use Authorization (EUA). This EUA will remain in effect (meaning this test can be used) for the duration of the COVID-19 declaration under Section 564(b)(1) of the Act, 21 U.S.C. section 360bbb-3(b)(1), unless the authorization is terminated or revoked.  Performed at [facility], [address] [place], [place] [address]   Resp panel by RT-PCR (RSV, Flu A&B, Covid) Anterior Nasal Swab     Status: None   Collection Time: 03/31/23  5:35 PM   Specimen: Anterior Nasal Swab  Result Value Ref Range Status   SARS Coronavirus 2 by RT PCR NEGATIVE NEGATIVE Final    Comment: (NOTE) SARS-CoV-2 target nucleic acids are NOT DETECTED.  The SARS-CoV-2 RNA is generally detectable in upper respiratory specimens during the acute phase of infection. The lowest concentration of SARS-CoV-2 viral copies this assay can detect is 138 copies/mL. A negative result does not preclude SARS-Cov-2 infection and should not be used as the sole basis for treatment or other patient management decisions. A negative result may occur with  improper specimen  collection/handling, submission of specimen other than nasopharyngeal swab, presence of viral mutation(s) within the areas targeted by  this assay, and inadequate number of viral copies(<138 copies/mL). A negative result must be combined with clinical observations, patient history, and epidemiological information. The expected result is Negative.  Fact Sheet for Patients:  BloggerCourse.com  Fact Sheet for Healthcare Providers:  SeriousBroker.it  This test is no t yet approved or cleared by the [place] FDA and  has been authorized for detection and/or diagnosis of SARS-CoV-2 by FDA under an Emergency Use Authorization (EUA). This EUA will remain  in effect (meaning this test can be used) for the duration of the COVID-19 declaration under Section 564(b)(1) of the Act, 21 U.S.C.section 360bbb-3(b)(1), unless the authorization is terminated  or revoked sooner.       Influenza A by PCR NEGATIVE NEGATIVE Final   Influenza B by PCR NEGATIVE NEGATIVE Final    Comment: (NOTE) The Xpert Xpress SARS-CoV-2/FLU/RSV plus assay is intended as an aid in the diagnosis of influenza from Nasopharyngeal swab specimens and should not be used as a sole basis for treatment. Nasal washings and aspirates are unacceptable for Xpert Xpress SARS-CoV-2/FLU/RSV testing.  Fact Sheet for Patients: BloggerCourse.com  Fact Sheet for Healthcare Providers: SeriousBroker.it  This test is not yet approved or cleared by the [place] FDA and has been authorized for detection and/or diagnosis of SARS-CoV-2 by FDA under an Emergency Use Authorization (EUA). This EUA will remain in effect (meaning this test can be used) for the duration of the COVID-19 declaration under Section 564(b)(1) of the Act, 21 U.S.C. section 360bbb-3(b)(1), unless the authorization is terminated or revoked.     Resp Syncytial  Virus by PCR NEGATIVE NEGATIVE Final    Comment: (NOTE) Fact Sheet for Patients: BloggerCourse.com  Fact Sheet for Healthcare Providers: SeriousBroker.it  This test is not yet approved or cleared by the [place] FDA and has been authorized for detection and/or diagnosis of SARS-CoV-2 by FDA under an Emergency Use Authorization (EUA). This EUA will remain in effect (meaning this test can be used) for the duration of the COVID-19 declaration under Section 564(b)(1) of the Act, 21 U.S.C. section 360bbb-3(b)(1), unless the authorization is terminated or revoked.  Performed at [facility], [address], [place], [place] [address]      Radiological Exams on Admission: CT HEAD WO CONTRAST ( ) Result Date: 03/31/2023 CLINICAL DATA:  Altered mental status. EXAM: CT HEAD WITHOUT CONTRAST TECHNIQUE: Contiguous axial images were obtained from the base of the skull through the vertex without intravenous contrast. RADIATION DOSE REDUCTION: This exam was performed according to the departmental dose-optimization program which includes automated exposure control, adjustment of the mA and/or kV according to patient size and/or use of iterative reconstruction technique. COMPARISON:  Head CT 03/26/2023. FINDINGS: Brain: No acute hemorrhage. Stable background of moderate chronic small-vessel disease with old perforator infarct in the left corona radiata. No new loss of gray-white differentiation. No hydrocephalus or extra-axial collection. No mass effect or midline shift. Vascular: No hyperdense vessel or unexpected calcification. Skull: No calvarial fracture or suspicious bone lesion. Skull base is unremarkable. Sinuses/Orbits: No acute findings. Unchanged chronic left maxillary sinusitis. Orbits are unremarkable. Other: None. IMPRESSION: 1. No acute intracranial abnormality. 2. Stable background of moderate chronic small-vessel disease with  old perforator infarct in the left corona radiata. 3. Unchanged chronic left maxillary sinusitis. Electronically Signed   By: Orvan Falconer M.D.   On: 03/31/2023 12:48   DG Chest 2 View Result Date: 03/31/2023 CLINICAL DATA:  History of chest pain. EXAM: CHEST - 2 VIEW COMPARISON:  03/26/2023  FINDINGS: Two views of the chest demonstrate low lung volumes with crowding of the vasculature. Heart size is upper limits of normal but stable. No focal airspace disease. No overt pulmonary edema. No pleural effusions. No acute bone abnormality. Negative for a pneumothorax. Trachea is mildly deviated towards the right and appears stable. IMPRESSION: Low lung volumes without acute cardiopulmonary disease. Electronically Signed   By: Richarda Overlie M.D.   On: 03/31/2023 12:46      Assessment/Plan Principal Problem:   Atrial fibrillation with RVR (HCC) Active Problems:   UTI (urinary tract infection)   Hypertension   Chronic diastolic CHF (congestive heart failure) (HCC)   Stroke (HCC)   HLD (hyperlipidemia)   Acute metabolic encephalopathy   Dementia (HCC)   Depression with anxiety   Assessment and Plan:  Atrial fibrillation with RVR (HCC): Heart rate up to 135, currently 120-130, no chest pain currently , troponin negative x 2  -Placed in PCU for observation -Eliquis -Start Cardizem drip -Check magnesium level -Check TSH level  UTI (urinary tract infection) -Rocephin -Follow-up urine culture  Hypertension -IV hydralazine as needed --hold Lasix and Cozaar due to worsening renal function  Chronic diastolic CHF (congestive heart failure) (HCC): 2D echo on 09/14/2021 showed EF 60-65% with grade 1 diastolic dysfunction.  Patient does not have leg edema JVD.  No shortness of breath.  CHF is compensated. -Check BNP  HLD (hyperlipidemia) -Lipitor  Stroke: -Plavix, Lipitor  Acute metabolic encephalopathy: Likely due to UTI.  CT head negative. -Frequent neurocheck -Fall  precaution  Dementia (HCC) -Continue Seroquel  Depression with anxiety -Continue home medications  Acute on chronic CKD stage IIIa: Likely due to UTI. -Avoid using renal toxic medications -IV fluid: Normal saline 75 cc/h for 8 hours     DVT ppx: on Eliquis  Code Status: DNR per her daughter  Family Communication: Yes, patient's daughter   at bed side.   Disposition Plan:  Anticipate discharge back to previous environment, ALF  Consults called:  none  Admission status and Level of care: Telemetry Medical:   for obs    Dispo: The patient is from: ALF              Anticipated d/c is to: ALF              Anticipated d/c date is: 1 day              Patient currently is not medically stable to d/c.    Severity of Illness:  The appropriate patient status for this patient is OBSERVATION. Observation status is judged to be reasonable and necessary in order to provide the required intensity of service to ensure the patient's safety. The patient's presenting symptoms, physical exam findings, and initial radiographic and laboratory data in the context of their medical condition is felt to place them at decreased risk for further clinical deterioration. Furthermore, it is anticipated that the patient will be medically stable for discharge from the hospital within 2 midnights of admission.        Date of Service 04/01/2023    Lorretta Harp [facility]   If 7PM-7AM, please contact night-coverage [email or web address] 04/01/2023, 12:00 AM

## 2023-03-31 NOTE — ED Notes (Signed)
Md in with pt and family

## 2023-03-31 NOTE — ED Notes (Signed)
Family reports altered mental status.  Pt was at cardiology appt today and was sent to the ER for afib.  Pt has hx of afib.  No chest pain now   no sob.    Daughterinlaw with pt.

## 2023-03-31 NOTE — ED Notes (Signed)
Repeat EKG performed at this time. Per MD Roxan Hockey pt needs room pt taken to ED 16 at this time.

## 2023-04-01 DIAGNOSIS — I5032 Chronic diastolic (congestive) heart failure: Secondary | ICD-10-CM | POA: Diagnosis not present

## 2023-04-01 DIAGNOSIS — G9341 Metabolic encephalopathy: Secondary | ICD-10-CM | POA: Diagnosis not present

## 2023-04-01 DIAGNOSIS — N3000 Acute cystitis without hematuria: Secondary | ICD-10-CM | POA: Diagnosis not present

## 2023-04-01 DIAGNOSIS — I4891 Unspecified atrial fibrillation: Secondary | ICD-10-CM | POA: Diagnosis not present

## 2023-04-01 LAB — CBC
HCT: 38 % (ref 36.0–46.0)
Hemoglobin: 12.5 g/dL (ref 12.0–15.0)
MCH: 28.3 pg (ref 26.0–34.0)
MCHC: 32.9 g/dL (ref 30.0–36.0)
MCV: 86 fL (ref 80.0–100.0)
Platelets: 219 10*3/uL (ref 150–400)
RBC: 4.42 MIL/uL (ref 3.87–5.11)
RDW: 13.2 % (ref 11.5–15.5)
WBC: 9.5 10*3/uL (ref 4.0–10.5)
nRBC: 0 % (ref 0.0–0.2)

## 2023-04-01 LAB — BASIC METABOLIC PANEL
Anion gap: 9 (ref 5–15)
BUN: 26 mg/dL — ABNORMAL HIGH (ref 8–23)
CO2: 21 mmol/L — ABNORMAL LOW (ref 22–32)
Calcium: 8.2 mg/dL — ABNORMAL LOW (ref 8.9–10.3)
Chloride: 106 mmol/L (ref 98–111)
Creatinine, Ser: 1.37 mg/dL — ABNORMAL HIGH (ref 0.44–1.00)
GFR, Estimated: 37 mL/min — ABNORMAL LOW (ref >60)
Glucose, Bld: 127 mg/dL — ABNORMAL HIGH (ref 70–99)
Potassium: 3.5 mmol/L (ref 3.5–5.1)
Sodium: 136 mmol/L (ref 135–145)

## 2023-04-01 MED ORDER — APIXABAN 2.5 MG PO TABS: 2.5000 mg | ORAL_TABLET | Freq: Two times a day (BID) | ORAL | 0 refills | Status: DC

## 2023-04-01 MED ORDER — CEPHALEXIN 250 MG PO CAPS: 250.0000 mg | ORAL_CAPSULE | Freq: Three times a day (TID) | ORAL | 0 refills | Status: AC

## 2023-04-01 MED ORDER — METOPROLOL TARTRATE 50 MG PO TABS: 50.0000 mg | ORAL_TABLET | Freq: Two times a day (BID) | ORAL | Status: DC

## 2023-04-01 MED ORDER — METOPROLOL SUCCINATE ER 50 MG PO TB24
50.0000 mg | ORAL_TABLET | Freq: Two times a day (BID) | ORAL | Status: DC
  Administered 2023-04-01: 50 mg via ORAL
  Filled 2023-04-01: qty 1

## 2023-04-01 MED ORDER — POTASSIUM CHLORIDE CRYS ER 10 MEQ PO TBCR: 10.0000 meq | EXTENDED_RELEASE_TABLET | Freq: Two times a day (BID) | ORAL | 0 refills | Status: AC

## 2023-04-01 MED ORDER — APIXABAN 2.5 MG PO TABS
2.5000 mg | ORAL_TABLET | Freq: Two times a day (BID) | ORAL | Status: DC
Start: 2023-04-01 — End: 2023-04-01
  Administered 2023-04-01: 2.5 mg via ORAL
  Filled 2023-04-01 (×2): qty 1

## 2023-04-01 NOTE — ED Notes (Signed)
CCMD contacted and patient placed on cardiac monitoring.

## 2023-04-01 NOTE — ED Notes (Signed)
Pt pulled iv out.

## 2023-04-01 NOTE — Evaluation (Signed)
 Occupational Therapy Evaluation Patient Details Name: NILAYA BOUIE MRN: [id number] DOB: January 27, 1932 Today's Date: 04/01/2023   History of Present Illness SARAANN ENNEKING is a 89 y.o. female with a history of PAF on Eliquis , vascular dementia, hypertension, hyperlipidemia, and strokes who presents with chest pain earlier today as well as with a change in mental status. The daughter states that over the last day the patient has also been somewhat less alert and talkative than at baseline, however she has not been obviously confused or lethargic.   Clinical Impression   Pt was seen for OT evaluation this date. Prior to hospital admission, pt was residing at [facility] where she was IND with ADLs and MOD I with RW use to walk to dining room. Pt had assist for medication management and needed encouragement for bathing.   Pt presents to acute OT demonstrating impaired ADL performance and functional mobility 2/2 weakness, low activity tolerance, mild balance deficits (See OT problem list for additional functional deficits). Pt currently requires CGA for STS from recliner and LB dressing to don pants. Pt ambulated in room to the sink and back to perform oral care with CGA for safety. Cueing required for hand placement on recliner versus pulling on RW and for sequencing during oral care. Pt denies pain. She is near her baseline function, but would benefit from skilled OT services to address weakness to ensure full return to PLOF and prevention of further weakness. Do anticipate the need for follow up OT services upon acute hospital DC.        If plan is discharge home, recommend the following: A little help with bathing/dressing/bathroom;Help with stairs or ramp for entrance    Functional Status Assessment  Patient has had a recent decline in their functional status and demonstrates the ability to make significant improvements in function in a reasonable and predictable amount of time.  Equipment  Recommendations  Other (comment) (defer)    Recommendations for Other Services       Precautions / Restrictions Precautions Precautions: Fall Restrictions Weight Bearing Restrictions Per Provider Order: No      Mobility Bed Mobility               General bed mobility comments: NT pt in recliner pre/post session    Transfers Overall transfer level: Needs assistance Equipment used: Rolling walker (2 wheels) Transfers: Sit to/from Stand Sit to Stand: Contact guard assist           General transfer comment: CGA for STS from recliner to RW with cues for hand placement      Balance Overall balance assessment: Needs assistance Sitting-balance support: Feet supported Sitting balance-Leahy Scale: Good Sitting balance - Comments: able to dress self to don pants seated in recliner with no LOB   Standing balance support: During functional activity, No upper extremity supported Standing balance-Leahy Scale: Fair Standing balance comment: able to stand at sink to perform oral care with no LOB and hands off walker at times                           ADL either performed or assessed with clinical judgement   ADL Overall ADL's : Needs assistance/impaired     Grooming: Oral care;Standing;Supervision/safety;Cueing for sequencing               Lower Body Dressing: Contact guard assist;Supervision/safety;Sitting/lateral leans;Sit to/from stand  Vision         Perception         Praxis         Pertinent Vitals/Pain Pain Assessment Pain Assessment: No/denies pain     Extremity/Trunk Assessment Upper Extremity Assessment Upper Extremity Assessment: Overall WFL for tasks assessed   Lower Extremity Assessment Lower Extremity Assessment: Overall WFL for tasks assessed       Communication Communication Communication: No apparent difficulties Cueing Techniques: Verbal cues;Tactile cues;Visual cues   Cognition  Arousal: Alert Behavior During Therapy: WFL for tasks assessed/performed, Flat affect Overall Cognitive Status: History of cognitive impairments - at baseline Area of Impairment: Memory, Following commands, Safety/judgement                     Memory: Decreased short-term memory, Decreased recall of precautions Following Commands: Follows one step commands with increased time Safety/Judgement: Decreased awareness of deficits, Decreased awareness of safety   Problem Solving: Slow processing, Decreased initiation, Difficulty sequencing, Requires verbal cues       General Comments  pt remains in A-fib, other vitals stable throughout    Exercises Other Exercises Other Exercises: Pt/family edu in role of acute OT and importance of therapy to maximize safety/IND to return to PLOF.   Shoulder Instructions      Home Living Family/patient expects to be discharged to:: Assisted living Living Arrangements: Alone   Type of Home: Independent living facility                       Home Equipment: [employer] (2 wheels)   Additional Comments: pt from [facility]      Prior Functioning/Environment Prior Level of Function : Needs assist  Cognitive Assist : ADLs (cognitive);Mobility (cognitive) Mobility (Cognitive): Set up cues ADLs (Cognitive): Set up cues       Mobility Comments: ambulates with RW to dining hall ADLs Comments: assist for medication only, occassionally sponge bathes self but per family it is very difficult to get her to bathe        OT Problem List: Decreased strength;Decreased range of motion;Decreased activity tolerance;Decreased safety awareness      OT Treatment/Interventions: Therapeutic exercise;Self-care/ADL training;Energy conservation;DME and/or AE instruction;Therapeutic activities;Patient/family education;Balance training    OT Goals(Current goals can be found in the care plan section) Acute Rehab OT Goals Patient Stated  Goal: improve strength to return to [facility] OT Goal Formulation: With patient/family Time For Goal Achievement: 04/15/23 Potential to Achieve Goals: Fair ADL Goals Pt Will Perform Lower Body Bathing: sitting/lateral leans;sit to/from stand;with supervision Pt Will Perform Upper Body Dressing: with set-up;sitting Pt Will Perform Lower Body Dressing: with modified independence;sitting/lateral leans;sit to/from stand Pt Will Transfer to Toilet: with modified independence;ambulating;regular height toilet Pt Will Perform Toileting - Clothing Manipulation and hygiene: with modified independence;sit to/from stand;sitting/lateral leans  OT Frequency: Min 1X/week    Co-evaluation              AM-PAC OT 6 Clicks Daily Activity     Outcome Measure Help from another person eating meals?: None Help from another person taking care of personal grooming?: A Little Help from another person toileting, which includes using toliet, bedpan, or urinal?: A Little Help from another person bathing (including washing, rinsing, drying)?: A Little Help from another person to put on and taking off regular upper body clothing?: None Help from another person to put on and taking off regular lower body clothing?: A Little 6 Click Score:  20   End of Session Equipment Utilized During Treatment: Rolling walker (2 wheels) Nurse Communication: Mobility status  Activity Tolerance: Patient tolerated treatment well Patient left: in chair;with call bell/phone within reach;with family/visitor present  OT Visit Diagnosis: Other abnormalities of gait and mobility (R26.89)                Time: 8995-8970 OT Time Calculation (min): 25 min Charges:  OT General Charges $OT Visit: 1 Visit OT Evaluation $OT Eval Moderate Complexity: 1 Mod OT Treatments $Self Care/Home Management : 8-22 mins Ahley Bulls, OTR/L  04/01/23, 11:10 AM  Roxy Filler E Elfreda Blanchet 04/01/2023, 11:07 AM

## 2023-04-01 NOTE — ED Notes (Signed)
 Assignment accepted from [facility], [facility]. This RN introduced self to family at the bedside and checked in with sitter at bedside. Patient is sleeping with mitts on at this time. Cardizem running in IV at 5 mL/hr.

## 2023-04-01 NOTE — Discharge Summary (Signed)
 Physician Discharge Summary   Patient: Brooke Pierce MRN: [id number] DOB: 09-27-1931  Admit date:     03/31/2023  Discharge date: 04/01/23  Discharge Physician: Concepcion Riser   PCP: Lyle Avelina LABOR, NP   Recommendations at discharge:   PCP follow up in 1 week. Cardiology follow up as suggested.  Discharge Diagnoses: Principal Problem:   Atrial fibrillation with RVR (HCC) Active Problems:   UTI (urinary tract infection)   Hypertension   Chronic diastolic CHF (congestive heart failure) (HCC)   Stroke (HCC)   HLD (hyperlipidemia)   Acute metabolic encephalopathy   Dementia (HCC)   Depression with anxiety  Resolved Problems:   * No resolved hospital problems. [facility] [place] Course: Brooke Pierce is a 89 y.o. female with medical history significant of HTN, HDL, dCHF, stroke, depression with anxiety, dementia, A-fib on Eliquis , RLS, hard of hearing, carotid artery stenosis, CKD-3A, who presents with weakness and confusion.   She has history of dementia and is more confused than baseline, cannot provide accurate medical history. Pt was recently hospitalized from 12/25 - 12/26 due to gastroenteritis and AKI. Per her daughter at bedside, today patient she in cardiologist office for follow-up visit, and was found to be more confused. Patient was found to have A-fib with RVR, with heart rate up to 135 in ED, UA abnormal admitted to hospitalist service for further management evaluation of A-fib with RVR, UTI, metabolic encephalopathy.  Patient is started on diltiazem  drip, IV Rocephin  therapy.  During hospital stay, she got confused overnight requiring sitter, bilateral mittens.  Patient's heart rate improved, seen by cardiologist who recommended to start metoprolol  p.o. therapy.  She is hemodynamically stable, family requested discharge her back to facility.  Ceftriaxone  changed to oral Keflex  therapy.  Resume nitrofurantoin  after Keflex .  Advised to continue taking Eliquis  2.5  mg twice daily.  Continue potassium supplementation with BMP follow-up outpatient.       Consultants: Cardiology Procedures performed: None Disposition: Skilled nursing facility Diet recommendation:  Discharge Diet Orders (From admission, onward)     Start     Ordered   04/01/23 0000  Diet - low sodium heart healthy        04/01/23 1326           Cardiac diet DISCHARGE MEDICATION: Allergies as of 04/01/2023       Reactions   Calcium -containing Compounds Nausea And Vomiting   Calcium -containing Compounds    Coreg [carvedilol] Itching   Coreg [carvedilol]    Iodine         Medication List     PAUSE taking these medications    nitrofurantoin  50 MG capsule Wait to take this until: April 07, 2023 Commonly known as: MACRODANTIN  Take 50 mg by mouth daily at 4 PM.       STOP taking these medications    aspirin  EC 81 MG tablet       TAKE these medications    acetaminophen  325 MG tablet Commonly known as: TYLENOL  Take 650 mg by mouth every 4 (four) hours as needed for mild pain or fever.   aluminum-magnesium  hydroxide-simethicone 200-200-20 MG/5ML Susp Commonly known as: MAALOX Take 30 mLs by mouth 4 (four) times daily as needed (heartburn or indigestion).   apixaban  2.5 MG Tabs tablet Commonly known as: ELIQUIS  Take 1 tablet (2.5 mg total) by mouth 2 (two) times daily. What changed:  medication strength how much to take   atorvastatin  80 MG tablet Commonly known as: LIPITOR  Take 80 mg  by mouth at bedtime.   cephALEXin  250 MG capsule Commonly known as: KEFLEX  Take 1 capsule (250 mg total) by mouth 3 (three) times daily for 5 days.   clopidogrel  75 MG tablet Commonly known as: PLAVIX  Take 75 mg by mouth daily.   D3 50 MCG (2000 UT) Tabs Generic drug: Cholecalciferol  Take 1 tablet by mouth daily.   dorzolamide -timolol  2-0.5 % ophthalmic solution Commonly known as: COSOPT  Place 1 drop into both eyes 2 (two) times daily.   DULoxetine  30  MG capsule Commonly known as: CYMBALTA  Take 30 mg by mouth daily. Take along with one 60 mg capsule for total 90 mg once daily   DULoxetine  60 MG capsule Commonly known as: CYMBALTA  Take 60 mg by mouth daily. Take along with one 30 mg capsule for total 90 mg once daily   famotidine  20 MG tablet Commonly known as: PEPCID  Take 20 mg by mouth in the morning.   furosemide  20 MG tablet Commonly known as: LASIX  Take 20 mg by mouth daily.   guaiFENesin  100 MG/5ML liquid Commonly known as: ROBITUSSIN Take 15 mLs by mouth every 6 (six) hours as needed for cough or to loosen phlegm.   latanoprost  0.005 % ophthalmic solution Commonly known as: XALATAN  Place 1 drop into both eyes daily at 4 PM.   loperamide  2 MG tablet Commonly known as: IMODIUM  A-D Take 4 mg by mouth 4 (four) times daily as needed for diarrhea or loose stools.   losartan  50 MG tablet Commonly known as: COZAAR  Take 50 mg by mouth daily.   magnesium  hydroxide 400 MG/5ML suspension Commonly known as: MILK OF MAGNESIA Take 30 mLs by mouth daily as needed for mild constipation or moderate constipation.   metoprolol  tartrate 50 MG tablet Commonly known as: LOPRESSOR  Take 50 mg by mouth 2 (two) times daily. What changed: Another medication with the same name was removed. Continue taking this medication, and follow the directions you see here.   mirtazapine  15 MG tablet Commonly known as: REMERON  Take 15 mg by mouth at bedtime.   ondansetron  4 MG tablet Commonly known as: ZOFRAN  Take 4 mg by mouth every 8 (eight) hours as needed.   potassium chloride  10 MEQ tablet Commonly known as: KLOR-CON  M Take 1 tablet (10 mEq total) by mouth 2 (two) times daily for 3 days.   QUEtiapine  25 MG tablet Commonly known as: SEROQUEL  Take 25 mg by mouth daily as needed (agitation).   QUEtiapine  50 MG tablet Commonly known as: SEROQUEL  Take 50 mg by mouth at bedtime.   Refresh Plus 0.5 % Soln Generic drug:  Carboxymethylcellulose Sod PF Place 1 drop into both eyes 4 (four) times daily.   Refresh Tears 0.5 % Soln Generic drug: carboxymethylcellulose Place 1 drop into both eyes in the morning, at noon, in the evening, and at bedtime.   sodium bicarbonate  650 MG tablet Take 1 tablet (650 mg total) by mouth 2 (two) times daily for 7 days.        Discharge Exam: Filed Weights   03/31/23 1130  Weight: 81 kg   General - Elderly Caucasian female, no apparent distress HEENT - PERRLA, EOMI, atraumatic head, non tender sinuses. Lung - Clear, rales, rhonchi, wheezes. Heart - S1, S2 heard, no murmurs, rubs, trace pedal edema. Abdomen-soft, nontender, nondistended.  Bowel sounds good Neuro - Alert, awake, pleasantly confused, non focal exam. Skin - Warm and dry.  Condition at discharge: stable  The results of significant diagnostics from this hospitalization (including imaging,  microbiology, ancillary and laboratory) are listed below for reference.   Imaging Studies: CT HEAD WO CONTRAST ( ) Result Date: 03/31/2023 CLINICAL DATA:  Altered mental status. EXAM: CT HEAD WITHOUT CONTRAST TECHNIQUE: Contiguous axial images were obtained from the base of the skull through the vertex without intravenous contrast. RADIATION DOSE REDUCTION: This exam was performed according to the departmental dose-optimization program which includes automated exposure control, adjustment of the mA and/or kV according to patient size and/or use of iterative reconstruction technique. COMPARISON:  Head CT 03/26/2023. FINDINGS: Brain: No acute hemorrhage. Stable background of moderate chronic small-vessel disease with old perforator infarct in the left corona radiata. No new loss of gray-white differentiation. No hydrocephalus or extra-axial collection. No mass effect or midline shift. Vascular: No hyperdense vessel or unexpected calcification. Skull: No calvarial fracture or suspicious bone lesion. Skull base is unremarkable.  Sinuses/Orbits: No acute findings. Unchanged chronic left maxillary sinusitis. Orbits are unremarkable. Other: None. IMPRESSION: 1. No acute intracranial abnormality. 2. Stable background of moderate chronic small-vessel disease with old perforator infarct in the left corona radiata. 3. Unchanged chronic left maxillary sinusitis. Electronically Signed   By: Ryan Chess M.D.   On: 03/31/2023 12:48   DG Chest 2 View Result Date: 03/31/2023 CLINICAL DATA:  History of chest pain. EXAM: CHEST - 2 VIEW COMPARISON:  03/26/2023 FINDINGS: Two views of the chest demonstrate low lung volumes with crowding of the vasculature. Heart size is upper limits of normal but stable. No focal airspace disease. No overt pulmonary edema. No pleural effusions. No acute bone abnormality. Negative for a pneumothorax. Trachea is mildly deviated towards the right and appears stable. IMPRESSION: Low lung volumes without acute cardiopulmonary disease. Electronically Signed   By: Juliene Balder M.D.   On: 03/31/2023 12:46   CT ABDOMEN PELVIS WO CONTRAST Result Date: 03/26/2023 CLINICAL DATA:  89 year old female with vomiting and diarrhea. EXAM: CT ABDOMEN AND PELVIS WITHOUT CONTRAST TECHNIQUE: Multidetector CT imaging of the abdomen and pelvis was performed following the standard protocol without IV contrast. RADIATION DOSE REDUCTION: This exam was performed according to the departmental dose-optimization program which includes automated exposure control, adjustment of the mA and/or kV according to patient size and/or use of iterative reconstruction technique. COMPARISON:  CT lumbar spine 09/13/2021. FINDINGS: Lower chest: Cardiomegaly. Only trace pericardial fluid which is likely physiologic. Bilateral lung base mosaic attenuation which could be chronic lung disease or combined gas trapping and atelectasis. No pleural effusion or confluent lung base opacity. Hepatobiliary: Negative noncontrast liver and gallbladder. Pancreas: Partially  atrophied. Spleen: Diminutive, negative. Adrenals/Urinary Tract: Adrenal glands are within normal limits. Nonobstructed kidneys, small bilateral simple fluid density renal cysts (no follow-up imaging recommended). No nephrolithiasis. Some asymmetric left renal cortical scarring. Decompressed ureters. Decompressed bladder. Stomach/Bowel: Redundant but decompressed large bowel in the pelvis. Decompressed proximal sigmoid colon with diverticulosis, no active inflammation. Descending and transverse colon are decompressed. Right colon also relatively decompressed. Normal appendix series 3, image 52. No convincing large bowel inflammation. Decompressed terminal ileum. Fluid-filled but nondilated other small bowel loops in the abdomen and pelvis. Mild gas containing stomach. Duodenum is mostly decompressed. No pneumoperitoneum. No free fluid or convincing mesenteric inflammation. Vascular/Lymphatic: Normal caliber abdominal aorta. Aortoiliac calcified atherosclerosis. Vascular patency is not evaluated in the absence of IV contrast. No lymphadenopathy identified. Reproductive: Surgically absent uterus, diminutive or absent ovaries. Other: No pelvis free fluid. Musculoskeletal: No acute osseous abnormality identified. Previous proximal left femur ORIF. IMPRESSION: 1. Fluid-filled but nondilated small bowel loops raising the possibility of  Enteritis. Normal appendix and no convincing large bowel inflammation. Mild sigmoid diverticulosis. 2. No other acute or inflammatory process identified in the noncontrast abdomen or pelvis. 3. Cardiomegaly,  Aortic Atherosclerosis (ICD10-I70.0). Electronically Signed   By: VEAR Hurst M.D.   On: 03/26/2023 07:57   DG Chest 1 View Result Date: 03/26/2023 CLINICAL DATA:  89 year old female with vomiting and diarrhea. EXAM: CHEST  1 VIEW COMPARISON:  Chest radiographs 02/23/2023. FINDINGS: Portable AP semi upright view at 0728 hours. Mildly lower lung volumes. Stable cardiomegaly and  mediastinal contours. Stable lung markings. No pneumothorax, pulmonary edema, pleural effusion or acute lung opacity. No acute osseous abnormality identified. Negative visible bowel gas. No evidence of pneumoperitoneum. IMPRESSION: Stable cardiomegaly. No acute cardiopulmonary abnormality. Electronically Signed   By: VEAR Hurst M.D.   On: 03/26/2023 07:53   CT Head Wo Contrast Result Date: 03/26/2023 CLINICAL DATA:  89 year old female with vomiting and diarrhea. EXAM: CT HEAD WITHOUT CONTRAST TECHNIQUE: Contiguous axial images were obtained from the base of the skull through the vertex without intravenous contrast. RADIATION DOSE REDUCTION: This exam was performed according to the departmental dose-optimization program which includes automated exposure control, adjustment of the mA and/or kV according to patient size and/or use of iterative reconstruction technique. COMPARISON:  Head CT 11/18/2021.  Brain MRI 09/13/2021. FINDINGS: Brain: Stable cerebral volume. Patchy and confluent scattered cerebral white matter, left basal ganglia hypodensity compatible with chronic small vessel disease and stable compared to last year. No midline shift, ventriculomegaly, mass effect, evidence of mass lesion, intracranial hemorrhage or evidence of cortically based acute infarction. Vascular: Extensive calcified atherosclerosis at the skull base. No suspicious intracranial vascular hyperdensity. Skull: Chronic bilateral TMJ degeneration. No acute osseous abnormality identified. Sinuses/Orbits: Visualized paranasal sinuses and mastoids are well aerated. Other: Leftward gaze. Visualized scalp soft tissues are within normal limits. IMPRESSION: No acute intracranial abnormality. Stable non contrast CT appearance of chronic cerebral small vessel disease. Electronically Signed   By: VEAR Hurst M.D.   On: 03/26/2023 07:52    Microbiology: Results for orders placed or performed during the hospital encounter of 03/31/23  Resp panel by  RT-PCR (RSV, Flu A&B, Covid) Anterior Nasal Swab     Status: None   Collection Time: 03/31/23  5:35 PM   Specimen: Anterior Nasal Swab  Result Value Ref Range Status   SARS Coronavirus 2 by RT PCR NEGATIVE NEGATIVE Final    Comment: (NOTE) SARS-CoV-2 target nucleic acids are NOT DETECTED.  The SARS-CoV-2 RNA is generally detectable in upper respiratory specimens during the acute phase of infection. The lowest concentration of SARS-CoV-2 viral copies this assay can detect is 138 copies/mL. A negative result does not preclude SARS-Cov-2 infection and should not be used as the sole basis for treatment or other patient management decisions. A negative result may occur with  improper specimen collection/handling, submission of specimen other than nasopharyngeal swab, presence of viral mutation(s) within the areas targeted by this assay, and inadequate number of viral copies(<138 copies/mL). A negative result must be combined with clinical observations, patient history, and epidemiological information. The expected result is Negative.  Fact Sheet for Patients:  bloggercourse.com  Fact Sheet for Healthcare Providers:  seriousbroker.it  This test is no t yet approved or cleared by the [place]  FDA and  has been authorized for detection and/or diagnosis of SARS-CoV-2 by FDA under an Emergency Use Authorization (EUA). This EUA will remain  in effect (meaning this test can be used) for the duration of the COVID-19 declaration  under Section 564(b)(1) of the Act, 21 U.S.C.section 360bbb-3(b)(1), unless the authorization is terminated  or revoked sooner.       Influenza A by PCR NEGATIVE NEGATIVE Final   Influenza B by PCR NEGATIVE NEGATIVE Final    Comment: (NOTE) The Xpert Xpress SARS-CoV-2/FLU/RSV plus assay is intended as an aid in the diagnosis of influenza from Nasopharyngeal swab specimens and should not be used as a sole basis  for treatment. Nasal washings and aspirates are unacceptable for Xpert Xpress SARS-CoV-2/FLU/RSV testing.  Fact Sheet for Patients: bloggercourse.com  Fact Sheet for Healthcare Providers: seriousbroker.it  This test is not yet approved or cleared by the [place]  FDA and has been authorized for detection and/or diagnosis of SARS-CoV-2 by FDA under an Emergency Use Authorization (EUA). This EUA will remain in effect (meaning this test can be used) for the duration of the COVID-19 declaration under Section 564(b)(1) of the Act, 21 U.S.C. section 360bbb-3(b)(1), unless the authorization is terminated or revoked.     Resp Syncytial Virus by PCR NEGATIVE NEGATIVE Final    Comment: (NOTE) Fact Sheet for Patients: bloggercourse.com  Fact Sheet for Healthcare Providers: seriousbroker.it  This test is not yet approved or cleared by the [place]  FDA and has been authorized for detection and/or diagnosis of SARS-CoV-2 by FDA under an Emergency Use Authorization (EUA). This EUA will remain in effect (meaning this test can be used) for the duration of the COVID-19 declaration under Section 564(b)(1) of the Act, 21 U.S.C. section 360bbb-3(b)(1), unless the authorization is terminated or revoked.  Performed at [facility], [address] [place], [place] [address]     Labs: CBC: Recent Labs  Lab 03/26/23 [phone] 03/27/23 0517 03/31/23 1132 04/01/23 0515  WBC 14.6* 4.2 8.7 9.5  HGB 14.2 10.8* 13.7 12.5  HCT 43.7 33.4* 41.8 38.0  MCV 86.4 84.8 83.6 86.0  PLT 241 155 261 219   Basic Metabolic Panel: Recent Labs  Lab 03/26/23 0629 03/26/23 0706 03/27/23 0517 03/31/23 1745 04/01/23 0515  NA 139  --  136 138 136  K 3.9  --  3.4* 4.2 3.5  CL 103  --  108 100 106  CO2 19*  --  19* 23 21*  GLUCOSE 177*  --  102* 115* 127*  BUN 38*  --  20 26* 26*  CREATININE  1.60*  --  0.98 1.74* 1.37*  CALCIUM  9.0  --  7.5* 9.3 8.2*  MG  --  2.2  --  2.4  --    Liver Function Tests: Recent Labs  Lab 03/26/23 0629 03/31/23 1745  AST 23 22  ALT 19 21  ALKPHOS 53 52  BILITOT 1.0 0.9  PROT 7.1 7.3  ALBUMIN 4.1 4.1   CBG: No results for input(s): GLUCAP in the last 168 hours.  Discharge time spent: 35 minutes.  Signed: Concepcion Riser, MD [facility] 04/01/2023

## 2023-04-01 NOTE — ED Notes (Signed)
Pt was pulling off leads as well as pulse ox, there was concern that pt would pull iv out. Placed soft mitts on L/R hands.

## 2023-04-01 NOTE — Evaluation (Signed)
 Physical Therapy Evaluation Patient Details Name: Brooke Pierce MRN: [id number] DOB: [birth year] Today's Date: 04/01/2023  History of Present Illness  Brooke Pierce is a 89 y.o. female with a history of PAF on Eliquis , vascular dementia, hypertension, hyperlipidemia, and strokes who presents with chest pain earlier today as well as with a change in mental status. The daughter states that over the last day the patient has also been somewhat less alert and talkative than at baseline, however she has not been obviously confused or lethargic.  Clinical Impression  The pt is presenting with a calm demeanor for PT session. She is agreeable to changing undergarments. The pt is incontinent and required increased time for toilet transfer and clean up. The pt is demonstrating good balance, however requires assistance with standing from a high surface and assistance for initial steadying when standing. At this time the pt is a great candidate for PT services within her living facility. PT will continue to follow while in house to optimize functional mobility.       If plan is discharge home, recommend the following: A little help with walking and/or transfers;A little help with bathing/dressing/bathroom;Direct supervision/assist for medications management;Assist for transportation;Help with stairs or ramp for entrance;Supervision due to cognitive status   Can travel by private vehicle   Yes    Equipment Recommendations None recommended by PT  Recommendations for Other Services       Functional Status Assessment Patient has had a recent decline in their functional status and demonstrates the ability to make significant improvements in function in a reasonable and predictable amount of time.     Precautions / Restrictions Precautions Precautions: Fall Restrictions Weight Bearing Restrictions Per Provider Order: No      Mobility  Bed Mobility Overal bed mobility: Modified Independent Bed  Mobility: Supine to Sit     Supine to sit: Supervision          Transfers Overall transfer level: Needs assistance Equipment used: Rolling walker (2 wheels) Transfers: Sit to/from Stand, Bed to chair/wheelchair/BSC Sit to Stand: Min assist, Contact guard assist (Min A for standing from toilet)                Ambulation/Gait Ambulation/Gait assistance: Supervision Gait Distance (Feet): 15 Feet Assistive device: Rolling walker (2 wheels) Gait Pattern/deviations: Step-through pattern       General Gait Details: required verbal cues for manuevering the RW  Stairs            Wheelchair Mobility     Tilt Bed    Modified Rankin (Stroke Patients Only)       Balance Overall balance assessment: Mild deficits observed, not formally tested                                           Pertinent Vitals/Pain Pain Assessment Pain Assessment: No/denies pain    Home Living Family/patient expects to be discharged to:: Assisted living Living Arrangements: Alone   Type of Home: Independent living facility           Home Equipment: Agricultural Consultant (2 wheels)      Prior Function Prior Level of Function : Needs assist  Cognitive Assist : ADLs (cognitive);Mobility (cognitive) Mobility (Cognitive): Set up cues ADLs (Cognitive): Set up cues               Extremity/Trunk Assessment   Upper Extremity  Assessment Upper Extremity Assessment: Overall WFL for tasks assessed    Lower Extremity Assessment Lower Extremity Assessment: Overall WFL for tasks assessed       Communication   Communication Communication: No apparent difficulties  Cognition Arousal: Alert Behavior During Therapy: WFL for tasks assessed/performed, Flat affect Overall Cognitive Status: History of cognitive impairments - at baseline                                          General Comments      Exercises     Assessment/Plan    PT Assessment  Patient needs continued PT services  PT Problem List Decreased activity tolerance;Decreased cognition       PT Treatment Interventions Gait training;Functional mobility training;Therapeutic activities;Therapeutic exercise;Balance training;Neuromuscular re-education    PT Goals (Current goals can be found in the Care Plan section)  Acute Rehab PT Goals Patient Stated Goal: Discharge from hospital PT Goal Formulation: With family Time For Goal Achievement: 04/08/23 Potential to Achieve Goals: Good    Frequency Min 2X/week     Co-evaluation               AM-PAC PT 6 Clicks Mobility  Outcome Measure Help needed turning from your back to your side while in a flat bed without using bedrails?: A Little Help needed moving from lying on your back to sitting on the side of a flat bed without using bedrails?: A Little Help needed moving to and from a bed to a chair (including a wheelchair)?: A Little Help needed standing up from a chair using your arms (e.g., wheelchair or bedside chair)?: A Little Help needed to walk in hospital room?: A Little Help needed climbing 3-5 steps with a railing? : A Little 6 Click Score: 18    End of Session   Activity Tolerance: Patient tolerated treatment well Patient left: in chair;with nursing/sitter in room;with family/visitor present;Other (comment) (MD entered at the end of session and requested removal of hand mitts. Mitts were not reapplied, RN called and notified prior to removal) Nurse Communication: Mobility status;Precautions PT Visit Diagnosis: Unsteadiness on feet (R26.81)    Time: 9088-9047 PT Time Calculation (min) (ACUTE ONLY): 41 min   Charges:   PT Evaluation $PT Eval Moderate Complexity: 1 Mod PT Treatments $Neuromuscular Re-education: 23-37 mins PT General Charges $$ ACUTE PT VISIT: 1 Visit         10:17 AM, 04/01/23 Brooke Pierce A. Manya PT, DPT Physical Therapist - [facility]   Brooke Pierce A Brooke Pierce 04/01/2023, 10:13 AM

## 2023-04-01 NOTE — ED Notes (Signed)
Pt alert, sitter with pt  mitts on both hands.  Pt refused to take any more meds at this time.

## 2023-04-01 NOTE — ED Notes (Signed)
Pt pulling monitor leads off, pulse ox.  Pt pulled mitts off.  Family with pt.  Charge nurse stephen rn aware.

## 2023-04-01 NOTE — Consult Note (Signed)
 [facility] CLINIC CARDIOLOGY CONSULT NOTE       Patient ID: Brooke Pierce MRN: [id number] DOB/AGE: 87/12/1931 87 y.o.  Admit date: 03/31/2023 Referring Physician Dr. Darci Primary Physician Lyle Avelina LABOR, NP  Primary Cardiologist Tinnie Maiden, NP Reason for Consultation atrial fibrillation RVR  HPI: Brooke Pierce is a 89 y.o. female  with a past medical history of atrial fibrillation/flutter, hypertension, hyperlipidemia, history of CVA 2023, vascular dementia who presented to the ED on 03/31/2023 for chest pain.  Found to be in atrial fibrillation RVR.  Cardiology was consulted for further evaluation.   Patient is a poor historian due to baseline dementia.  Majority of the history is provided by family member present today.  States that she has been dealing with atrial fibrillation/flutter for the last few months.  Also reportedly has low pain tolerance.  She was seen yesterday at [facility] cardiology by Tinnie Maiden, NP at which time she was complaining of significant chest discomfort and she was brought to the ED.  Workup in the ED notable for EKG demonstrating atrial flutter with variable block with a rate in the 90s.  Labs revealed creatinine 1.74, potassium 4.2, magnesium  2.4, hemoglobin 13.7, WBC 8.7.  BNP mildly elevated at 351.  Troponins trended 13 > 13.  CXR without evidence of acute issue.  CT head with stable small vessel disease and prior infarct.  She was started on IV diltiazem  in the ED.  At the time of my evaluation this morning patient is resting comfortably in bedside chair with family present at bedside.  She denies any chest pain, shortness of breath, palpitations this morning during my evaluation.  Family member reports that she is also currently being treated for UTI.  Overall her heart rate has improved significantly on diltiazem  infusion.  Patient is now asymptomatic and not having any issues.  Review of systems complete and found to be negative unless listed  above    Past Medical History:  Diagnosis Date   Anxiety    Carotid atherosclerosis    Cataract    Depression    Glaucoma    HTN (hypertension)    Hypertension    Insomnia    Osteoporosis    Restless leg syndrome    Sensorineural hearing loss    Stroke [facility])    Vascular dementia (HCC)     History reviewed. No pertinent surgical history.  (Not in a hospital admission)  Social History   Socioeconomic History   Marital status: Single    Spouse name: Not on file   Number of children: Not on file   Years of education: Not on file   Highest education level: Not on file  Occupational History   Not on file  Tobacco Use   Smoking status: Never   Smokeless tobacco: Never  Substance and Sexual Activity   Alcohol  use: Not Currently   Drug use: Never   Sexual activity: Not on file  Other Topics Concern   Not on file  Social History Narrative   ** Merged History Encounter **       Social Drivers of Health   Financial Resource Strain: Low Risk  (08/14/2022)   Received from [facility]   Overall Financial Resource Strain (CARDIA)    Difficulty of Paying Living Expenses: Not hard at all  Food Insecurity: No Food Insecurity (03/26/2023)   Hunger Vital Sign    Worried About Running Out of Food in the Last Year: Never true  Ran Out of Food in the Last Year: Never true  Transportation Needs: No Transportation Needs (03/26/2023)   PRAPARE - Administrator, Civil Service (Medical): No    Lack of Transportation (Non-Medical): No  Physical Activity: Not on file  Stress: Not on file  Social Connections: Not on file  Intimate Partner Violence: Not At Risk (03/26/2023)   Humiliation, Afraid, Rape, and Kick questionnaire    Fear of Current or Ex-Partner: No    Emotionally Abused: No    Physically Abused: No    Sexually Abused: No    Family History  Problem Relation Age of Onset   Dementia Brother      Vitals:   04/01/23 0600 04/01/23 1007  04/01/23 1100 04/01/23 1200  BP: (!) 158/63 120/70 (!) 130/56 134/70  Pulse: 68 71 69 69  Resp: 15 (!) 23 17 (!) 23  Temp:  (!) 97.3 F (36.3 C)    TempSrc:  Oral    SpO2: 96% 96% 98% 96%  Weight:      Height:        PHYSICAL EXAM General: Chronically ill-appearing elderly female, well nourished, in no acute distress. HEENT: Normocephalic and atraumatic. Neck: No JVD.  Lungs: Normal respiratory effort on room air. Clear bilaterally to auscultation. No wheezes, crackles, rhonchi.  Heart: [place]y irregular, controlled rate. Normal S1 and S2 without gallops or murmurs.  Abdomen: Non-distended appearing.  Msk: Normal strength and tone for age. Extremities: Warm and well perfused. No clubbing, cyanosis. No edema.  Neuro: Alert and oriented X 3. Psych: Answers questions appropriately.   Labs: Basic Metabolic Panel: Recent Labs    03/31/23 1745 04/01/23 0515  NA 138 136  K 4.2 3.5  CL 100 106  CO2 23 21*  GLUCOSE 115* 127*  BUN 26* 26*  CREATININE 1.74* 1.37*  CALCIUM  9.3 8.2*  MG 2.4  --    Liver Function Tests: Recent Labs    03/31/23 1745  AST 22  ALT 21  ALKPHOS 52  BILITOT 0.9  PROT 7.3  ALBUMIN 4.1   Recent Labs    03/31/23 1132  LIPASE 36   CBC: Recent Labs    03/31/23 1132 04/01/23 0515  WBC 8.7 9.5  HGB 13.7 12.5  HCT 41.8 38.0  MCV 83.6 86.0  PLT 261 219   Cardiac Enzymes: Recent Labs    03/31/23 1132 03/31/23 1745  TROPONINIHS 13 13   BNP: Recent Labs    03/31/23 1133  BNP 351.3*   D-Dimer: No results for input(s): DDIMER in the last 72 hours. Hemoglobin A1C: No results for input(s): HGBA1C in the last 72 hours. Fasting Lipid Panel: No results for input(s): CHOL, HDL, LDLCALC, TRIG, CHOLHDL, LDLDIRECT in the last 72 hours. Thyroid  Function Tests: Recent Labs    03/31/23 1745  TSH 1.167   Anemia Panel: No results for input(s): VITAMINB12, FOLATE, FERRITIN, TIBC, IRON, RETICCTPCT in the last 72  hours.   Radiology: CT HEAD WO CONTRAST ( ) Result Date: 03/31/2023 CLINICAL DATA:  Altered mental status. EXAM: CT HEAD WITHOUT CONTRAST TECHNIQUE: Contiguous axial images were obtained from the base of the skull through the vertex without intravenous contrast. RADIATION DOSE REDUCTION: This exam was performed according to the departmental dose-optimization program which includes automated exposure control, adjustment of the mA and/or kV according to patient size and/or use of iterative reconstruction technique. COMPARISON:  Head CT 03/26/2023. FINDINGS: Brain: No acute hemorrhage. Stable background of moderate chronic small-vessel disease with old perforator infarct  in the left corona radiata. No new loss of gray-white differentiation. No hydrocephalus or extra-axial collection. No mass effect or midline shift. Vascular: No hyperdense vessel or unexpected calcification. Skull: No calvarial fracture or suspicious bone lesion. Skull base is unremarkable. Sinuses/Orbits: No acute findings. Unchanged chronic left maxillary sinusitis. Orbits are unremarkable. Other: None. IMPRESSION: 1. No acute intracranial abnormality. 2. Stable background of moderate chronic small-vessel disease with old perforator infarct in the left corona radiata. 3. Unchanged chronic left maxillary sinusitis. Electronically Signed   By: Ryan Chess M.D.   On: 03/31/2023 12:48   DG Chest 2 View Result Date: 03/31/2023 CLINICAL DATA:  History of chest pain. EXAM: CHEST - 2 VIEW COMPARISON:  03/26/2023 FINDINGS: Two views of the chest demonstrate low lung volumes with crowding of the vasculature. Heart size is upper limits of normal but stable. No focal airspace disease. No overt pulmonary edema. No pleural effusions. No acute bone abnormality. Negative for a pneumothorax. Trachea is mildly deviated towards the right and appears stable. IMPRESSION: Low lung volumes without acute cardiopulmonary disease. Electronically Signed   By:  Juliene Balder M.D.   On: 03/31/2023 12:46   CT ABDOMEN PELVIS WO CONTRAST Result Date: 03/26/2023 CLINICAL DATA:  89 year old female with vomiting and diarrhea. EXAM: CT ABDOMEN AND PELVIS WITHOUT CONTRAST TECHNIQUE: Multidetector CT imaging of the abdomen and pelvis was performed following the standard protocol without IV contrast. RADIATION DOSE REDUCTION: This exam was performed according to the departmental dose-optimization program which includes automated exposure control, adjustment of the mA and/or kV according to patient size and/or use of iterative reconstruction technique. COMPARISON:  CT lumbar spine 09/13/2021. FINDINGS: Lower chest: Cardiomegaly. Only trace pericardial fluid which is likely physiologic. Bilateral lung base mosaic attenuation which could be chronic lung disease or combined gas trapping and atelectasis. No pleural effusion or confluent lung base opacity. Hepatobiliary: Negative noncontrast liver and gallbladder. Pancreas: Partially atrophied. Spleen: Diminutive, negative. Adrenals/Urinary Tract: Adrenal glands are within normal limits. Nonobstructed kidneys, small bilateral simple fluid density renal cysts (no follow-up imaging recommended). No nephrolithiasis. Some asymmetric left renal cortical scarring. Decompressed ureters. Decompressed bladder. Stomach/Bowel: Redundant but decompressed large bowel in the pelvis. Decompressed proximal sigmoid colon with diverticulosis, no active inflammation. Descending and transverse colon are decompressed. Right colon also relatively decompressed. Normal appendix series 3, image 52. No convincing large bowel inflammation. Decompressed terminal ileum. Fluid-filled but nondilated other small bowel loops in the abdomen and pelvis. Mild gas containing stomach. Duodenum is mostly decompressed. No pneumoperitoneum. No free fluid or convincing mesenteric inflammation. Vascular/Lymphatic: Normal caliber abdominal aorta. Aortoiliac calcified  atherosclerosis. Vascular patency is not evaluated in the absence of IV contrast. No lymphadenopathy identified. Reproductive: Surgically absent uterus, diminutive or absent ovaries. Other: No pelvis free fluid. Musculoskeletal: No acute osseous abnormality identified. Previous proximal left femur ORIF. IMPRESSION: 1. Fluid-filled but nondilated small bowel loops raising the possibility of Enteritis. Normal appendix and no convincing large bowel inflammation. Mild sigmoid diverticulosis. 2. No other acute or inflammatory process identified in the noncontrast abdomen or pelvis. 3. Cardiomegaly,  Aortic Atherosclerosis (ICD10-I70.0). Electronically Signed   By: VEAR Hurst M.D.   On: 03/26/2023 07:57   DG Chest 1 View Result Date: 03/26/2023 CLINICAL DATA:  89 year old female with vomiting and diarrhea. EXAM: CHEST  1 VIEW COMPARISON:  Chest radiographs 02/23/2023. FINDINGS: Portable AP semi upright view at 0728 hours. Mildly lower lung volumes. Stable cardiomegaly and mediastinal contours. Stable lung markings. No pneumothorax, pulmonary edema, pleural effusion or acute lung opacity.  No acute osseous abnormality identified. Negative visible bowel gas. No evidence of pneumoperitoneum. IMPRESSION: Stable cardiomegaly. No acute cardiopulmonary abnormality. Electronically Signed   By: VEAR Hurst M.D.   On: 03/26/2023 07:53   CT Head Wo Contrast Result Date: 03/26/2023 CLINICAL DATA:  89 year old female with vomiting and diarrhea. EXAM: CT HEAD WITHOUT CONTRAST TECHNIQUE: Contiguous axial images were obtained from the base of the skull through the vertex without intravenous contrast. RADIATION DOSE REDUCTION: This exam was performed according to the departmental dose-optimization program which includes automated exposure control, adjustment of the mA and/or kV according to patient size and/or use of iterative reconstruction technique. COMPARISON:  Head CT 11/18/2021.  Brain MRI 09/13/2021. FINDINGS: Brain: Stable  cerebral volume. Patchy and confluent scattered cerebral white matter, left basal ganglia hypodensity compatible with chronic small vessel disease and stable compared to last year. No midline shift, ventriculomegaly, mass effect, evidence of mass lesion, intracranial hemorrhage or evidence of cortically based acute infarction. Vascular: Extensive calcified atherosclerosis at the skull base. No suspicious intracranial vascular hyperdensity. Skull: Chronic bilateral TMJ degeneration. No acute osseous abnormality identified. Sinuses/Orbits: Visualized paranasal sinuses and mastoids are well aerated. Other: Leftward gaze. Visualized scalp soft tissues are within normal limits. IMPRESSION: No acute intracranial abnormality. Stable non contrast CT appearance of chronic cerebral small vessel disease. Electronically Signed   By: VEAR Hurst M.D.   On: 03/26/2023 07:52    ECHO 03/2023: MODERATE LEFT VENTRICULAR SYSTOLIC DYSFUNCTION WITH MILD LVH  ESTIMATED EF: 35%, CALC EF(2D): 37%  DIASTOLIC FUNCTION CAN'T BE DETERMINED  NORMAL RIGHT VENTRICULAR SYSTOLIC FUNCTION  VALVULAR REGURGITATION: No AR, MODERATE MR, MILD PR, MILD TR  NO VALVULAR STENOSIS   TELEMETRY reviewed by me 04/01/2023: Atrial flutter rate 60-70s  EKG reviewed by me: Atrial flutter with variable block rate 96 bpm  Data reviewed by me 04/01/2023: last 24h vitals tele labs imaging I/O ED provider note, admission H&P  Principal Problem:   Atrial fibrillation with RVR (HCC) Active Problems:   Stroke (HCC)   Hypertension   Dementia (HCC)   UTI (urinary tract infection)   Chronic diastolic CHF (congestive heart failure) (HCC)   HLD (hyperlipidemia)   Depression with anxiety   Acute metabolic encephalopathy    ASSESSMENT AND PLAN:  Brooke Pierce is a 89 y.o. female  with a past medical history of atrial fibrillation/flutter, hypertension, hyperlipidemia, history of CVA 2023, vascular dementia who presented to the ED on 03/31/2023 for  chest pain.  Found to be in atrial fibrillation RVR.  Cardiology was consulted for further evaluation.   # Atrial flutter # Chest pain Patient with history of atrial fibrillation/flutter sent over to ED from cardiology office yesterday due to complaints of chest pain.  Troponins trended 13 > 13.  EKG demonstrated atrial flutter with variable block with a rate in the 90s, no evidence of ischemia.  She was started on IV diltiazem  in the ED for rate control. -Start p.o. metoprolol  succinate 50 mg twice daily.  Discontinue diltiazem  infusion. -Continue Eliquis  2.5 mg twice daily for stroke risk reduction.  # Hx CVA Patient with hx of CVA.  -Continue atorvastatin  80 mg daily and plavix  75 mg daily.   Ok for discharge today from a cardiac perspective. Will arrange for follow up in clinic with Tinnie Maiden, NP in 1-2 weeks.    This patient's plan of care was discussed and created with Dr. Florencio and he is in agreement.  Signed: Danita Bloch, PA-C  04/01/2023, 2:40 PM [facility]  Cardiology

## 2023-04-01 NOTE — ED Notes (Signed)
Sitter with pt and family

## 2023-04-04 LAB — URINE CULTURE: Culture: >=100000 — AB

## 2023-04-28 ENCOUNTER — Emergency Department: Payer: Medicare PPO

## 2023-04-28 ENCOUNTER — Other Ambulatory Visit: Payer: Self-pay

## 2023-04-28 ENCOUNTER — Inpatient Hospital Stay
Admission: EM | Admit: 2023-04-28 | Discharge: 2023-05-02 | DRG: 291 | Disposition: A | Discharge status: Hospice | Payer: Medicare PPO | Source: Skilled Nursing Facility | Attending: Internal Medicine | Admitting: Internal Medicine

## 2023-04-28 DIAGNOSIS — Z91041 Radiographic dye allergy status: Secondary | ICD-10-CM | POA: Diagnosis not present

## 2023-04-28 DIAGNOSIS — Z6829 Body mass index (BMI) 29.0-29.9, adult: Secondary | ICD-10-CM | POA: Diagnosis not present

## 2023-04-28 DIAGNOSIS — I5A Non-ischemic myocardial injury (non-traumatic): Secondary | ICD-10-CM | POA: Diagnosis present

## 2023-04-28 DIAGNOSIS — E876 Hypokalemia: Secondary | ICD-10-CM | POA: Diagnosis present

## 2023-04-28 DIAGNOSIS — G2581 Restless legs syndrome: Secondary | ICD-10-CM | POA: Diagnosis present

## 2023-04-28 DIAGNOSIS — Z66 Do not resuscitate: Secondary | ICD-10-CM | POA: Diagnosis present

## 2023-04-28 DIAGNOSIS — Z515 Encounter for palliative care: Secondary | ICD-10-CM

## 2023-04-28 DIAGNOSIS — Z8673 Personal history of transient ischemic attack (TIA), and cerebral infarction without residual deficits: Secondary | ICD-10-CM

## 2023-04-28 DIAGNOSIS — I130 Hypertensive heart and chronic kidney disease with heart failure and stage 1 through stage 4 chronic kidney disease, or unspecified chronic kidney disease: Principal | ICD-10-CM | POA: Diagnosis present

## 2023-04-28 DIAGNOSIS — N1831 Chronic kidney disease, stage 3a: Secondary | ICD-10-CM | POA: Diagnosis present

## 2023-04-28 DIAGNOSIS — H9190 Unspecified hearing loss, unspecified ear: Secondary | ICD-10-CM | POA: Diagnosis present

## 2023-04-28 DIAGNOSIS — F418 Other specified anxiety disorders: Secondary | ICD-10-CM | POA: Diagnosis present

## 2023-04-28 DIAGNOSIS — I5033 Acute on chronic diastolic (congestive) heart failure: Secondary | ICD-10-CM | POA: Diagnosis present

## 2023-04-28 DIAGNOSIS — F0153 Vascular dementia, unspecified severity, with mood disturbance: Secondary | ICD-10-CM | POA: Diagnosis present

## 2023-04-28 DIAGNOSIS — E782 Mixed hyperlipidemia: Secondary | ICD-10-CM | POA: Diagnosis not present

## 2023-04-28 DIAGNOSIS — E663 Overweight: Secondary | ICD-10-CM | POA: Diagnosis present

## 2023-04-28 DIAGNOSIS — Z7902 Long term (current) use of antithrombotics/antiplatelets: Secondary | ICD-10-CM

## 2023-04-28 DIAGNOSIS — Z7901 Long term (current) use of anticoagulants: Secondary | ICD-10-CM

## 2023-04-28 DIAGNOSIS — I10 Essential (primary) hypertension: Secondary | ICD-10-CM | POA: Diagnosis present

## 2023-04-28 DIAGNOSIS — F0154 Vascular dementia, unspecified severity, with anxiety: Secondary | ICD-10-CM | POA: Diagnosis present

## 2023-04-28 DIAGNOSIS — Z888 Allergy status to other drugs, medicaments and biological substances status: Secondary | ICD-10-CM

## 2023-04-28 DIAGNOSIS — I480 Paroxysmal atrial fibrillation: Secondary | ICD-10-CM | POA: Diagnosis present

## 2023-04-28 DIAGNOSIS — Z1152 Encounter for screening for COVID-19: Secondary | ICD-10-CM | POA: Diagnosis not present

## 2023-04-28 DIAGNOSIS — Z79899 Other long term (current) drug therapy: Secondary | ICD-10-CM | POA: Diagnosis not present

## 2023-04-28 DIAGNOSIS — I509 Heart failure, unspecified: Principal | ICD-10-CM

## 2023-04-28 DIAGNOSIS — E785 Hyperlipidemia, unspecified: Secondary | ICD-10-CM | POA: Diagnosis present

## 2023-04-28 DIAGNOSIS — I639 Cerebral infarction, unspecified: Secondary | ICD-10-CM | POA: Diagnosis present

## 2023-04-28 LAB — URINALYSIS, ROUTINE W REFLEX MICROSCOPIC
Bilirubin Urine: NEGATIVE
Glucose, UA: NEGATIVE mg/dL
Hgb urine dipstick: NEGATIVE
Ketones, ur: NEGATIVE mg/dL
Leukocytes,Ua: NEGATIVE
Nitrite: NEGATIVE
Protein, ur: 30 mg/dL — AB
RBC / HPF: 0 RBC/hpf (ref 0–5)
Specific Gravity, Urine: 1.024 (ref 1.005–1.030)
pH: 5 (ref 5.0–8.0)

## 2023-04-28 LAB — CBC
HCT: 37.4 % (ref 36.0–46.0)
Hemoglobin: 12.2 g/dL (ref 12.0–15.0)
MCH: 27.7 pg (ref 26.0–34.0)
MCHC: 32.6 g/dL (ref 30.0–36.0)
MCV: 84.8 fL (ref 80.0–100.0)
Platelets: 243 10*3/uL (ref 150–400)
RBC: 4.41 MIL/uL (ref 3.87–5.11)
RDW: 14.1 % (ref 11.5–15.5)
WBC: 8.9 10*3/uL (ref 4.0–10.5)
nRBC: 0 % (ref 0.0–0.2)

## 2023-04-28 LAB — COMPREHENSIVE METABOLIC PANEL
ALT: 18 U/L (ref 0–44)
AST: 17 U/L (ref 15–41)
Albumin: 3.6 g/dL (ref 3.5–5.0)
Alkaline Phosphatase: 49 U/L (ref 38–126)
Anion gap: 11 (ref 5–15)
BUN: 25 mg/dL — ABNORMAL HIGH (ref 8–23)
CO2: 23 mmol/L (ref 22–32)
Calcium: 9.3 mg/dL (ref 8.9–10.3)
Chloride: 103 mmol/L (ref 98–111)
Creatinine, Ser: 1.17 mg/dL — ABNORMAL HIGH (ref 0.44–1.00)
GFR, Estimated: 44 mL/min — ABNORMAL LOW (ref >60)
Glucose, Bld: 111 mg/dL — ABNORMAL HIGH (ref 70–99)
Potassium: 3.2 mmol/L — ABNORMAL LOW (ref 3.5–5.1)
Sodium: 137 mmol/L (ref 135–145)
Total Bilirubin: 1.3 mg/dL — ABNORMAL HIGH (ref 0.0–1.2)
Total Protein: 6.8 g/dL (ref 6.5–8.1)

## 2023-04-28 LAB — RESP PANEL BY RT-PCR (RSV, FLU A&B, COVID)  RVPGX2
Influenza A by PCR: NEGATIVE
Influenza B by PCR: NEGATIVE
Resp Syncytial Virus by PCR: NEGATIVE
SARS Coronavirus 2 by RT PCR: NEGATIVE

## 2023-04-28 LAB — BRAIN NATRIURETIC PEPTIDE: B Natriuretic Peptide: 1055 pg/mL — ABNORMAL HIGH (ref 0.0–100.0)

## 2023-04-28 LAB — MAGNESIUM: Magnesium: 2.1 mg/dL (ref 1.7–2.4)

## 2023-04-28 LAB — PHOSPHORUS: Phosphorus: 3.3 mg/dL (ref 2.5–4.6)

## 2023-04-28 LAB — TROPONIN I (HIGH SENSITIVITY): Troponin I (High Sensitivity): 26 ng/L — ABNORMAL HIGH (ref <18)

## 2023-04-28 MED ORDER — ALBUTEROL SULFATE (2.5 MG/3ML) 0.083% IN NEBU
2.5000 mg | INHALATION_SOLUTION | RESPIRATORY_TRACT | Status: DC | PRN
  Filled 2023-04-28: qty 3

## 2023-04-28 MED ORDER — LOSARTAN POTASSIUM 50 MG PO TABS
50.0000 mg | ORAL_TABLET | Freq: Every day | ORAL | Status: DC
  Administered 2023-04-30 – 2023-05-02 (×3): 50 mg via ORAL
  Filled 2023-04-28 (×3): qty 1

## 2023-04-28 MED ORDER — LOPERAMIDE HCL 2 MG PO CAPS: 4.0000 mg | ORAL_CAPSULE | Freq: Four times a day (QID) | ORAL | Status: DC | PRN

## 2023-04-28 MED ORDER — ACETAMINOPHEN 325 MG PO TABS
650.0000 mg | ORAL_TABLET | Freq: Four times a day (QID) | ORAL | Status: DC | PRN
  Administered 2023-04-29 – 2023-04-30 (×3): 650 mg via ORAL
  Filled 2023-04-28 (×3): qty 2

## 2023-04-28 MED ORDER — DORZOLAMIDE HCL-TIMOLOL MAL 2-0.5 % OP SOLN
1.0000 [drp] | Freq: Two times a day (BID) | OPHTHALMIC | Status: DC
  Administered 2023-04-29 – 2023-05-02 (×6): 1 [drp] via OPHTHALMIC
  Filled 2023-04-28: qty 10

## 2023-04-28 MED ORDER — POTASSIUM CHLORIDE CRYS ER 20 MEQ PO TBCR
40.0000 meq | EXTENDED_RELEASE_TABLET | Freq: Once | ORAL | Status: AC
  Administered 2023-04-29: 40 meq via ORAL
  Filled 2023-04-28: qty 2

## 2023-04-28 MED ORDER — HYDRALAZINE HCL 20 MG/ML IJ SOLN: 5.0000 mg | INTRAMUSCULAR | Status: DC | PRN

## 2023-04-28 MED ORDER — DIPHENHYDRAMINE HCL 50 MG/ML IJ SOLN: 12.5000 mg | Freq: Three times a day (TID) | INTRAMUSCULAR | Status: DC | PRN

## 2023-04-28 MED ORDER — QUETIAPINE FUMARATE 25 MG PO TABS
50.0000 mg | ORAL_TABLET | Freq: Every day | ORAL | Status: DC
  Administered 2023-04-29 – 2023-05-01 (×4): 50 mg via ORAL
  Filled 2023-04-28 (×4): qty 2

## 2023-04-28 MED ORDER — APIXABAN 2.5 MG PO TABS
2.5000 mg | ORAL_TABLET | Freq: Two times a day (BID) | ORAL | Status: DC
  Administered 2023-04-29 – 2023-04-30 (×3): 2.5 mg via ORAL
  Filled 2023-04-28 (×5): qty 1

## 2023-04-28 MED ORDER — METOPROLOL TARTRATE 50 MG PO TABS
50.0000 mg | ORAL_TABLET | Freq: Two times a day (BID) | ORAL | Status: DC
  Administered 2023-04-29 – 2023-05-02 (×7): 50 mg via ORAL
  Filled 2023-04-28 (×3): qty 1
  Filled 2023-04-28: qty 2
  Filled 2023-04-28 (×2): qty 1
  Filled 2023-04-28: qty 2
  Filled 2023-04-28: qty 1

## 2023-04-28 MED ORDER — DULOXETINE HCL 60 MG PO CPEP: 60.0000 mg | ORAL_CAPSULE | Freq: Every day | ORAL | Status: DC

## 2023-04-28 MED ORDER — DM-GUAIFENESIN ER 30-600 MG PO TB12: 1.0000 | ORAL_TABLET | Freq: Two times a day (BID) | ORAL | Status: DC | PRN

## 2023-04-28 MED ORDER — NITROFURANTOIN MACROCRYSTAL 50 MG PO CAPS
50.0000 mg | ORAL_CAPSULE | Freq: Every day | ORAL | Status: DC
  Administered 2023-04-29 – 2023-05-01 (×3): 50 mg via ORAL
  Filled 2023-04-28 (×7): qty 1

## 2023-04-28 MED ORDER — FAMOTIDINE 20 MG PO TABS
20.0000 mg | ORAL_TABLET | Freq: Every morning | ORAL | Status: DC
  Administered 2023-04-29 – 2023-05-02 (×4): 20 mg via ORAL
  Filled 2023-04-28 (×4): qty 1

## 2023-04-28 MED ORDER — MAGNESIUM HYDROXIDE 400 MG/5ML PO SUSP: 30.0000 mL | Freq: Every day | ORAL | Status: DC | PRN

## 2023-04-28 MED ORDER — FUROSEMIDE 10 MG/ML IJ SOLN
40.0000 mg | Freq: Once | INTRAMUSCULAR | Status: DC
  Filled 2023-04-28: qty 4

## 2023-04-28 MED ORDER — CLOPIDOGREL BISULFATE 75 MG PO TABS
75.0000 mg | ORAL_TABLET | Freq: Every day | ORAL | Status: DC
  Administered 2023-04-29 – 2023-05-02 (×4): 75 mg via ORAL
  Filled 2023-04-28 (×4): qty 1

## 2023-04-28 MED ORDER — DULOXETINE HCL 30 MG PO CPEP
90.0000 mg | ORAL_CAPSULE | Freq: Every day | ORAL | Status: DC
  Administered 2023-04-30 – 2023-05-02 (×3): 90 mg via ORAL
  Filled 2023-04-28 (×3): qty 3

## 2023-04-28 MED ORDER — ATORVASTATIN CALCIUM 80 MG PO TABS
80.0000 mg | ORAL_TABLET | Freq: Every day | ORAL | Status: DC
  Administered 2023-04-29 – 2023-05-01 (×4): 80 mg via ORAL
  Filled 2023-04-28 (×2): qty 1
  Filled 2023-04-28: qty 4
  Filled 2023-04-28: qty 1

## 2023-04-28 MED ORDER — MIRTAZAPINE 15 MG PO TABS
15.0000 mg | ORAL_TABLET | Freq: Every day | ORAL | Status: DC
  Administered 2023-04-29 – 2023-05-01 (×4): 15 mg via ORAL
  Filled 2023-04-28 (×4): qty 1

## 2023-04-28 MED ORDER — FUROSEMIDE 10 MG/ML IJ SOLN
40.0000 mg | Freq: Two times a day (BID) | INTRAMUSCULAR | Status: DC
  Administered 2023-04-28 – 2023-05-01 (×6): 40 mg via INTRAVENOUS
  Filled 2023-04-28 (×5): qty 4

## 2023-04-28 MED ORDER — LATANOPROST 0.005 % OP SOLN
1.0000 [drp] | Freq: Every day | OPHTHALMIC | Status: DC
  Administered 2023-04-29 – 2023-05-01 (×3): 1 [drp] via OPHTHALMIC
  Filled 2023-04-28 (×2): qty 2.5

## 2023-04-28 NOTE — H&P (Signed)
History and Physical    Brooke Pierce ZOX:[id number] DOB: 01-Jan-1932 DOA: 04/28/2023  Referring MD/NP/PA:   PCP: Bryson Corona, NP   Patient coming from:  The patient is coming from ALF   Chief Complaint: SOB, weakness  HPI: Brooke Pierce is a 89 y.o. female with medical history significant of HTN, HDL, dCHF, stroke, depression with anxiety, dementia, A-fib on Eliquis, RLS, hard of hearing, carotid artery stenosis, CKD-3A, who presents with weakness and SOB.  Per her daughter-in-law, patient has not been feeling well in the past several days.  Patient has mild cough, mild shortness of breath, no chest pain, fever or chills.  Patient has generalized weakness, poor appetite, decreased oral intake.  She also reports dysuria and burning on urination.  No nausea, vomiting, diarrhea or abdominal pain.  She has bilateral leg edema.  Her mental status is close to baseline.   Data reviewed independently and ED Course: pt was found to have BNP 1055, WBC 8.9, potassium 3.2, stable renal function, troponin 26, negative urinalysis, negative PCR for COVID, flu and RSV.  Chest x-ray showed cardiomegaly and vascular congestion.  Patient is admitted to telemetry bed as inpatient.   EKG: I have personally reviewed.  A-fib, QTc 524, anteroseptal infarction pattern   Review of Systems:   General: no fevers, chills, no body weight gain, has poor appetite, has fatigue HEENT: no blurry vision, hearing changes or sore throat Respiratory: has dyspnea, coughing, no wheezing CV: no chest pain, no palpitations GI: no nausea, vomiting, abdominal pain, diarrhea, constipation GU: no dysuria, burning on urination, increased urinary frequency, hematuria  Ext: has leg edema Neuro: no unilateral weakness, numbness, or tingling, no vision change or hearing loss Skin: no rash, no skin tear. MSK: No muscle spasm, no deformity, no limitation of range of movement in spin Heme: No easy bruising.  Travel  history: No recent long distant travel.   Allergy:  Allergies  Allergen Reactions   Calcium-Containing Compounds Nausea And Vomiting   Calcium-Containing Compounds    Coreg [Carvedilol] Itching   Coreg [Carvedilol]    Iodine     Past Medical History:  Diagnosis Date   Anxiety    Carotid atherosclerosis    Cataract    Depression    Glaucoma    HTN (hypertension)    Hypertension    Insomnia    Osteoporosis    Restless leg syndrome    Sensorineural hearing loss    Stroke [facility])    Vascular dementia (HCC)     History reviewed. No pertinent surgical history.  Social History:  reports that she has never smoked. She has never used smokeless tobacco. She reports that she does not currently use alcohol. She reports that she does not use drugs.  Family History:  Family History  Problem Relation Age of Onset   Dementia Brother      Prior to Admission medications   Medication Sig Start Date End Date Taking? Authorizing Provider  acetaminophen (TYLENOL) 325 MG tablet Take 650 mg by mouth every 4 (four) hours as needed for mild pain or fever.    [provider]  aluminum-magnesium hydroxide-simethicone (MAALOX) 200-200-20 MG/5ML SUSP Take 30 mLs by mouth 4 (four) times daily as needed (heartburn or indigestion).    [provider]  apixaban (ELIQUIS) 2.5 MG TABS tablet Take 1 tablet (2.5 mg total) by mouth 2 (two) times daily. 04/01/23 06/30/23  Marcelino Duster, MD  atorvastatin (LIPITOR) 80 MG tablet Take 80 mg by mouth  at bedtime.    [provider]  Carboxymethylcellulose Sod PF (REFRESH PLUS) 0.5 % SOLN Place 1 drop into both eyes 4 (four) times daily.    [provider]  clopidogrel (PLAVIX) 75 MG tablet Take 75 mg by mouth daily. 03/26/23   [provider]  D3 50 MCG (2000 UT) TABS Take 1 tablet by mouth daily. 08/22/21   [provider]  dorzolamide-timolol (COSOPT) 2-0.5 % ophthalmic solution Place 1 drop into both  eyes 2 (two) times daily.    [provider]  DULoxetine (CYMBALTA) 30 MG capsule Take 30 mg by mouth daily. Take along with one 60 mg capsule for total 90 mg once daily 08/22/21   [provider]  DULoxetine (CYMBALTA) 60 MG capsule Take 60 mg by mouth daily. Take along with one 30 mg capsule for total 90 mg once daily 08/22/21   [provider]  famotidine (PEPCID) 20 MG tablet Take 20 mg by mouth in the morning.    [provider]  furosemide (LASIX) 20 MG tablet Take 20 mg by mouth daily. 03/05/23   [provider]  guaiFENesin (ROBITUSSIN) 100 MG/5ML liquid Take 15 mLs by mouth every 6 (six) hours as needed for cough or to loosen phlegm.    [provider]  latanoprost (XALATAN) 0.005 % ophthalmic solution Place 1 drop into both eyes daily at 4 PM.    [provider]  loperamide (IMODIUM A-D) 2 MG tablet Take 4 mg by mouth 4 (four) times daily as needed for diarrhea or loose stools.    [provider]  losartan (COZAAR) 50 MG tablet Take 50 mg by mouth daily. 03/20/23   [provider]  magnesium hydroxide (MILK OF MAGNESIA) 400 MG/5ML suspension Take 30 mLs by mouth daily as needed for mild constipation or moderate constipation.    [provider]  metoprolol tartrate (LOPRESSOR) 50 MG tablet Take 50 mg by mouth 2 (two) times daily.    [provider]  mirtazapine (REMERON) 15 MG tablet Take 15 mg by mouth at bedtime.    [provider]  nitrofurantoin (MACRODANTIN) 50 MG capsule Take 50 mg by mouth daily at 4 PM.    [provider]  ondansetron (ZOFRAN) 4 MG tablet Take 4 mg by mouth every 8 (eight) hours as needed. 03/28/23   [provider]  potassium chloride (KLOR-CON M) 10 MEQ tablet Take 1 tablet (10 mEq total) by mouth 2 (two) times daily for 3 days. 04/01/23 04/04/23  Marcelino Duster, MD  QUEtiapine (SEROQUEL) 25 MG tablet Take 25 mg by mouth daily as needed  (agitation).    [provider]  QUEtiapine (SEROQUEL) 50 MG tablet Take 50 mg by mouth at bedtime.    [provider]  REFRESH TEARS 0.5 % SOLN Place 1 drop into both eyes in the morning, at noon, in the evening, and at bedtime. Patient not taking: Reported on 03/31/2023 08/31/21   [provider]    Physical Exam: Vitals:   04/28/23 1456 04/28/23 1457 04/28/23 2016 04/28/23 2300  BP: (!) 150/76  (!) 163/84 (!) 158/69  Pulse: 93  63 (!) 59  Resp: 18  (!) 22   Temp: 98.2 F (36.8 C)     TempSrc: Oral     SpO2: 96%  97% 93%  Weight:  77.1 kg    Height:  5\' 5"  (1.651 m)     General: Not in acute distress HEENT:  Eyes: PERRL, EOMI, no jaundice       ENT: No discharge from the ears and nose, no pharynx injection, no tonsillar enlargement.        Neck: positive JVD, no bruit, no mass felt. Heme: No neck lymph node enlargement. Cardiac: S1/S2, RRR, No murmurs, No gallops or rubs. Respiratory: has fine crackles bilaterally GI: Soft, nondistended, nontender, no rebound pain, no organomegaly, BS present. GU: No hematuria Ext: has 1+ pitting leg edema bilaterally. 1+DP/PT pulse bilaterally. Musculoskeletal: No joint deformities, No joint redness or warmth, no limitation of ROM in spin. Skin: No rashes.  Neuro: Alert, following command,, cranial nerves II-XII grossly intact, moves all extremities normally. Psych: Patient is not psychotic, no suicidal or hemocidal ideation.  Labs on Admission: I have personally reviewed following labs and imaging studies  CBC: Recent Labs  Lab 04/28/23 2100  WBC 8.9  HGB 12.2  HCT 37.4  MCV 84.8  PLT 243   Basic Metabolic Panel: Recent Labs  Lab 04/28/23 2100  NA 137  K 3.2*  CL 103  CO2 23  GLUCOSE 111*  BUN 25*  CREATININE 1.17*  CALCIUM 9.3  MG 2.1  PHOS 3.3   GFR: Estimated Creatinine Clearance: 32.8 mL/min (A) (by C-G formula based on SCr of 1.17 mg/dL (H)). Liver Function Tests: Recent Labs   Lab 04/28/23 2100  AST 17  ALT 18  ALKPHOS 49  BILITOT 1.3*  PROT 6.8  ALBUMIN 3.6   No results for input(s): "LIPASE", "AMYLASE" in the last 168 hours. No results for input(s): "AMMONIA" in the last 168 hours. Coagulation Profile: No results for input(s): "INR", "PROTIME" in the last 168 hours. Cardiac Enzymes: No results for input(s): "CKTOTAL", "CKMB", "CKMBINDEX", "TROPONINI" in the last 168 hours. BNP (last 3 results) No results for input(s): "PROBNP" in the last 8760 hours. HbA1C: No results for input(s): "HGBA1C" in the last 72 hours. CBG: No results for input(s): "GLUCAP" in the last 168 hours. Lipid Profile: No results for input(s): "CHOL", "HDL", "LDLCALC", "TRIG", "CHOLHDL", "LDLDIRECT" in the last 72 hours. Thyroid Function Tests: No results for input(s): "TSH", "T4TOTAL", "FREET4", "T3FREE", "THYROIDAB" in the last 72 hours. Anemia Panel: No results for input(s): "VITAMINB12", "FOLATE", "FERRITIN", "TIBC", "IRON", "RETICCTPCT" in the last 72 hours. Urine analysis:    Component Value Date/Time   COLORURINE YELLOW (A) 04/28/2023 2013   APPEARANCEUR HAZY (A) 04/28/2023 2013   LABSPEC 1.024 04/28/2023 2013   PHURINE 5.0 04/28/2023 2013   GLUCOSEU NEGATIVE 04/28/2023 2013   HGBUR NEGATIVE 04/28/2023 2013   BILIRUBINUR NEGATIVE 04/28/2023 2013   KETONESUR NEGATIVE 04/28/2023 2013   PROTEINUR 30 (A) 04/28/2023 2013   NITRITE NEGATIVE 04/28/2023 2013   LEUKOCYTESUR NEGATIVE 04/28/2023 2013   Sepsis Labs: @LABRCNTIP (procalcitonin:4,lacticidven:4) ) Recent Results (from the past 240 hours)  Resp panel by RT-PCR (RSV, Flu A&B, Covid) Anterior Nasal Swab     Status: None   Collection Time: 04/28/23  9:00 PM   Specimen: Anterior Nasal Swab  Result Value Ref Range Status   SARS Coronavirus 2 by RT PCR NEGATIVE NEGATIVE Final    Comment: (NOTE) SARS-CoV-2 target nucleic acids are NOT DETECTED.  The SARS-CoV-2 RNA is generally detectable in upper  respiratory specimens during the acute phase of infection. The lowest concentration of SARS-CoV-2 viral copies this assay can detect is 138 copies/mL. A negative result does not preclude SARS-Cov-2 infection and should not be used as the sole basis for treatment or other patient management decisions. A negative result may occur  with  improper specimen collection/handling, submission of specimen other than nasopharyngeal swab, presence of viral mutation(s) within the areas targeted by this assay, and inadequate number of viral copies(<138 copies/mL). A negative result must be combined with clinical observations, patient history, and epidemiological information. The expected result is Negative.  Fact Sheet for Patients:  BloggerCourse.com  Fact Sheet for Healthcare Providers:  SeriousBroker.it  This test is no t yet approved or cleared by the [place] FDA and  has been authorized for detection and/or diagnosis of SARS-CoV-2 by FDA under an Emergency Use Authorization (EUA). This EUA will remain  in effect (meaning this test can be used) for the duration of the COVID-19 declaration under Section 564(b)(1) of the Act, 21 U.S.C.section 360bbb-3(b)(1), unless the authorization is terminated  or revoked sooner.       Influenza A by PCR NEGATIVE NEGATIVE Final   Influenza B by PCR NEGATIVE NEGATIVE Final    Comment: (NOTE) The Xpert Xpress SARS-CoV-2/FLU/RSV plus assay is intended as an aid in the diagnosis of influenza from Nasopharyngeal swab specimens and should not be used as a sole basis for treatment. Nasal washings and aspirates are unacceptable for Xpert Xpress SARS-CoV-2/FLU/RSV testing.  Fact Sheet for Patients: BloggerCourse.com  Fact Sheet for Healthcare Providers: SeriousBroker.it  This test is not yet approved or cleared by the [place] FDA and has been  authorized for detection and/or diagnosis of SARS-CoV-2 by FDA under an Emergency Use Authorization (EUA). This EUA will remain in effect (meaning this test can be used) for the duration of the COVID-19 declaration under Section 564(b)(1) of the Act, 21 U.S.C. section 360bbb-3(b)(1), unless the authorization is terminated or revoked.     Resp Syncytial Virus by PCR NEGATIVE NEGATIVE Final    Comment: (NOTE) Fact Sheet for Patients: BloggerCourse.com  Fact Sheet for Healthcare Providers: SeriousBroker.it  This test is not yet approved or cleared by the [place] FDA and has been authorized for detection and/or diagnosis of SARS-CoV-2 by FDA under an Emergency Use Authorization (EUA). This EUA will remain in effect (meaning this test can be used) for the duration of the COVID-19 declaration under Section 564(b)(1) of the Act, 21 U.S.C. section 360bbb-3(b)(1), unless the authorization is terminated or revoked.  Performed at [facility], [address], [place], [place] [address]      Radiological Exams on Admission:   Assessment/Plan Principal Problem:   Acute on chronic diastolic (congestive) heart failure (HCC) Active Problems:   Myocardial injury   Paroxysmal A-fib (HCC)   Hypertension   Stroke (HCC)   Hypokalemia   HLD (hyperlipidemia)   Chronic kidney disease, stage 3a (HCC)   Depression with anxiety   Overweight (BMI 25.0-29.9)   Assessment and Plan:   Acute on chronic diastolic (congestive) heart failure [facility]): Patient has leg edema, significantly elevated BNP 1055, vascular congestion by chest x-ray, clinically consistent with CHF exacerbation.  2D echo on 09/14/2021 showed EF 60 to 65% with grade 1 diastolic dysfunction.  -Will admit to tele bed   as inpatient -Lasix 40 mg bid by IV -2d echo -Daily weights -strict I/O's -Low salt diet -Fluid restriction -As needed bronchodilators for  shortness of breath  Myocardial injury: Troponin 26, no chest pain.  Likely due to demand ischemia. -Trend troponin -Patient is on Eliquis, Plavix and Lipitor  Paroxysmal A-fib (HCC): Heart rate 60-90s -Metoprolol -Eliquis  Hypertension -IV hydralazine as needed -Metoprolol, Cozaar  History of stroke (HCC) -Lipitor, Plavix  Hypokalemia: Potassium 3.2, magnesium 2.1,  phosphorus 3.3 -Repleted potassium  HLD (hyperlipidemia) -Lipitor  Chronic kidney disease, stage 3a [facility]): Renal function stable.  Baseline creatinine 1.37 on 03/04/2023.  Her creatinine is 1.17, BUN 25, GFR 44 -Follow-up with BMP  Depression with anxiety -Continue home Seroquel, Remeron  Overweight (BMI 25.0-29.9): Body weight 77.1 kg, BMI 29.29 -Encourage losing weight -Exercise and healthy diet    DVT ppx: on Eliquis  Code Status: DNR  Family Communication:     patient's daughter-in-law   at bed side.       Disposition Plan:  Anticipate discharge back to previous environment  Consults called: None  Admission status and Level of care: Telemetry Cardiac:   as inpt        Dispo: The patient is from: ALF              Anticipated d/c is to: ALF              Anticipated d/c date is: 2 days              Patient currently is not medically stable to d/c.    Severity of Illness:  The appropriate patient status for this patient is INPATIENT. Inpatient status is judged to be reasonable and necessary in order to provide the required intensity of service to ensure the patient's safety. The patient's presenting symptoms, physical exam findings, and initial radiographic and laboratory data in the context of their chronic comorbidities is felt to place them at high risk for further clinical deterioration. Furthermore, it is not anticipated that the patient will be medically stable for discharge from the hospital within 2 midnights of admission.   * I certify that at the point of admission it is my clinical  judgment that the patient will require inpatient hospital care spanning beyond 2 midnights from the point of admission due to high intensity of service, high risk for further deterioration and high frequency of surveillance required.*       Date of Service 04/29/2023    Lorretta Harp [facility]   If 7PM-7AM, please contact night-coverage [email or web address] 04/29/2023, 12:56 AM

## 2023-04-28 NOTE — ED Triage Notes (Signed)
Pt POA sts that pt is from the [place] [facility] [place] and sts that pt possibly has a UTI as pt dementia has gotten a little worse and pt sts that it burns to urinate.

## 2023-04-28 NOTE — ED Provider Notes (Signed)
[facility] Provider Note    Event Date/Time   First MD Initiated Contact with Patient 04/28/23 1843     (approximate)   History   Painful Urination   HPI  Brooke Pierce is a 89 y.o. female with a history of dementia, hypertension, atrial fibrillation, CHF presents with reported increased confusion, decreased energy, complaints of comfort with urination.  No fever reported.  Daughter reports she seems to be nearly at her baseline at this time although possibly with some labored breathing     Physical Exam   Triage Vital Signs: ED Triage Vitals  Encounter Vitals Group     BP 04/28/23 1456 (!) 150/76     Systolic BP Percentile --      Diastolic BP Percentile --      Pulse Rate 04/28/23 1456 93     Resp 04/28/23 1456 18     Temp 04/28/23 1456 98.2 F (36.8 C)     Temp Source 04/28/23 1456 Oral     SpO2 04/28/23 1456 96 %     Weight 04/28/23 1457 77.1 kg (170 lb)     Height 04/28/23 1457 1.651 m (5\' 5" )     Head Circumference --      Peak Flow --      Pain Score 04/28/23 1457 3     Pain Loc --      Pain Education --      Exclude from Growth Chart --     Most recent vital signs: Vitals:   04/28/23 1456 04/28/23 2016  BP: (!) 150/76 (!) 163/84  Pulse: 93 63  Resp: 18 (!) 22  Temp: 98.2 F (36.8 C)   SpO2: 96% 97%     General: Awake, no distress.  CV:  Good peripheral perfusion.  Resp:  Mild tachypnea Abd:  Soft, nontender Other:  No lower extremity edema  ED Results / Procedures / Treatments   Labs (all labs ordered are listed, but only abnormal results are displayed) Labs Reviewed  URINALYSIS, ROUTINE W REFLEX MICROSCOPIC - Abnormal; Notable for the following components:      Result Value   Color, Urine YELLOW (*)    APPearance HAZY (*)    Protein, ur 30 (*)    Bacteria, UA RARE (*)    All other components within normal limits  COMPREHENSIVE METABOLIC PANEL - Abnormal; Notable for the following components:   Potassium  3.2 (*)    Glucose, Bld 111 (*)    BUN 25 (*)    Creatinine, Ser 1.17 (*)    Total Bilirubin 1.3 (*)    GFR, Estimated 44 (*)    All other components within normal limits  BRAIN NATRIURETIC PEPTIDE - Abnormal; Notable for the following components:   B Natriuretic Peptide 1,055.0 (*)    All other components within normal limits  TROPONIN I (HIGH SENSITIVITY) - Abnormal; Notable for the following components:   Troponin I (High Sensitivity) 26 (*)    All other components within normal limits  RESP PANEL BY RT-PCR (RSV, FLU A&B, COVID)  [id number]  CBC  MAGNESIUM  PHOSPHORUS     EKG  ED ECG REPORT I, Jene Every, the attending physician, personally viewed and interpreted this ECG.  Date: 04/28/2023 ED ECG REPORT I, Jene Every, the attending physician, personally viewed and interpreted this ECG.  Date: 04/28/2023  Rhythm: Atrial fibrillation QRS Axis: normal Intervals: Abnormal ST/T Wave abnormalities: normal Narrative Interpretation: no evidence of acute ischemia  RADIOLOGY Chest x-ray viewed by me, no acute normality    PROCEDURES:  Critical Care performed:   Procedures   MEDICATIONS ORDERED IN ED: Medications  furosemide (LASIX) injection 40 mg (has no administration in time range)  albuterol (VENTOLIN HFA) 108 (90 Base) MCG/ACT inhaler 2 puff (has no administration in time range)  dextromethorphan-guaiFENesin (MUCINEX DM) 30-600 MG per 12 hr tablet 1 tablet (has no administration in time range)  diphenhydrAMINE (BENADRYL) injection 12.5 mg (has no administration in time range)  hydrALAZINE (APRESOLINE) injection 5 mg (has no administration in time range)  acetaminophen (TYLENOL) tablet 650 mg (has no administration in time range)  potassium chloride SA (KLOR-CON M) CR tablet 40 mEq (has no administration in time range)     IMPRESSION / MDM / ASSESSMENT AND PLAN / ED COURSE  I reviewed the triage vital signs and the nursing notes. Patient's  presentation is most consistent with acute presentation with potential threat to life or bodily function.  Patient presents with possible altered mental status, decreased energy, she appears at her baseline now.  Differential includes metabolic encephalopathy, UTI, pneumonia, electrolyte abnormality  Urinalysis is reassuring, will obtain labs, chest x-ray, EKG  Chest x-ray concerning for mild edema, BNP elevated, troponin mildly elevated, will give IV Lasix and admit to the hospitalist team      FINAL CLINICAL IMPRESSION(S) / ED DIAGNOSES   Final diagnoses:  Acute on chronic congestive heart failure, unspecified heart failure type (HCC)     Rx / DC Orders   ED Discharge Orders     None        Note:  This document was prepared using Dragon voice recognition software and may include unintentional dictation errors.   Jene Every, MD 04/28/23 2249

## 2023-04-29 ENCOUNTER — Inpatient Hospital Stay
Admit: 2023-04-29 | Discharge: 2023-04-29 | Disposition: A | Discharge status: Home / Self Care | Payer: Medicare PPO | Attending: Internal Medicine | Admitting: Internal Medicine

## 2023-04-29 ENCOUNTER — Encounter: Payer: Self-pay | Admitting: Internal Medicine

## 2023-04-29 DIAGNOSIS — I480 Paroxysmal atrial fibrillation: Secondary | ICD-10-CM | POA: Diagnosis not present

## 2023-04-29 DIAGNOSIS — I10 Essential (primary) hypertension: Secondary | ICD-10-CM | POA: Diagnosis not present

## 2023-04-29 DIAGNOSIS — F418 Other specified anxiety disorders: Secondary | ICD-10-CM | POA: Diagnosis not present

## 2023-04-29 DIAGNOSIS — E782 Mixed hyperlipidemia: Secondary | ICD-10-CM

## 2023-04-29 DIAGNOSIS — I5033 Acute on chronic diastolic (congestive) heart failure: Secondary | ICD-10-CM | POA: Diagnosis not present

## 2023-04-29 LAB — BASIC METABOLIC PANEL
Anion gap: 9 (ref 5–15)
BUN: 25 mg/dL — ABNORMAL HIGH (ref 8–23)
CO2: 24 mmol/L (ref 22–32)
Calcium: 8.7 mg/dL — ABNORMAL LOW (ref 8.9–10.3)
Chloride: 106 mmol/L (ref 98–111)
Creatinine, Ser: 1.19 mg/dL — ABNORMAL HIGH (ref 0.44–1.00)
GFR, Estimated: 43 mL/min — ABNORMAL LOW (ref >60)
Glucose, Bld: 131 mg/dL — ABNORMAL HIGH (ref 70–99)
Potassium: 3.7 mmol/L (ref 3.5–5.1)
Sodium: 139 mmol/L (ref 135–145)

## 2023-04-29 LAB — TROPONIN I (HIGH SENSITIVITY)
Troponin I (High Sensitivity): 29 ng/L — ABNORMAL HIGH (ref <18)
Troponin I (High Sensitivity): 29 ng/L — ABNORMAL HIGH (ref <18)

## 2023-04-29 NOTE — Progress Notes (Signed)
*  PRELIMINARY RESULTS* Echocardiogram 2D Echocardiogram has been performed.  Carolyne Fiscal 04/29/2023, 4:00 PM

## 2023-04-29 NOTE — ED Notes (Addendum)
Pt becoming increasingly agitated at this time. Pt repositioned, comfort items provided, and pt in bed at this time. MD notified.

## 2023-04-29 NOTE — ED Notes (Signed)
Messaged pharmacy about 1600 medications not being available.

## 2023-04-29 NOTE — ED Notes (Signed)
Echo tech in room.

## 2023-04-29 NOTE — ED Notes (Signed)
Ambulated to bedside commode. Patient had large bowel movement and void.

## 2023-04-29 NOTE — ED Notes (Signed)
TRANSPORT PAGED @ THIS TIME TO TRANSPORT PT TO IN-PATIENT ASSIGNED ROOM

## 2023-04-29 NOTE — Progress Notes (Signed)
Progress Note   Patient: Brooke Pierce WJX:[id number] DOB: 03-02-1932 DOA: 04/28/2023     1 DOS: the patient was seen and examined on 04/29/2023   Brief hospital course: Brooke Pierce is a 89 y.o. female with medical history significant of HTN, HDL, dCHF, stroke, depression with anxiety, dementia, A-fib on Eliquis, RLS, hard of hearing, carotid artery stenosis, CKD-3A, who presents with weakness and SOB.  Patient is admitted to the hospitalist service for evaluation of diastolic CHF flareup.  Assessment and Plan: Acute on chronic diastolic CHF- Continue IV Lasix 40 mg twice daily. 2D echocardiogram pending. Daily weights, strict input and output. IV bronchodilators as needed ordered.  Mildly elevated troponin of 26- In the setting of CHF.  No chest pain. Continue home medications Plavix, Lipitor, Eliquis.  Paroxysmal atrial fibrillation- Heart rate well-controlled. Continue metoprolol, Eliquis therapy.  Hypertension- Patient will be continued on metoprolol, Cozaar therapy.  Hypokalemia- Oral potassium supplementation. Recheck and replete as needed.  Hyperlipidemia History of stroke Patient be continued on Lipitor, Plavix.  CKD stage IIIa- Stable renal function. Continue to monitor daily BMP as she is on diuretics.  Depression and anxiety- Continue home dose Seroquel, Remeron therapy.       Encourage oral diet, supplements. Out of bed to chair. Incentive spirometry. Nursing supportive care. Fall, aspiration precautions. DVT prophylaxis   Code Status: Limited: Do not attempt resuscitation (DNR) -DNR-LIMITED -Do Not Intubate/DNI   Subjective: Patient is seen and examined today morning in the emergency department.  She is lying comfortably.  Denies any complaints of shortness of breath or chest pain.  Daughter at bedside states that she is not eating, feeling weak not getting out of bed..  Physical Exam: Vitals:   04/29/23 1107 04/29/23 1247 04/29/23 1450  04/29/23 1455  BP:   (!) 144/85   Pulse:   73   Resp: 19  (!) 23 19  Temp:  97.9 F (36.6 C)    TempSrc:  Oral    SpO2:   93%   Weight:      Height:        General - Elderly ill Caucasian female, no apparent distress HEENT - PERRLA, EOMI, atraumatic head, non tender sinuses. Lung - Clear, diffuse rales, rhonchi, no wheezes. Heart - S1, S2 heard, no murmurs, rubs, 1+ pitting pedal edema. Abdomen - Soft, non tender non distended, bowel sounds good Neuro - Alert, awake and oriented x 3, non focal exam. Skin - Warm and dry.  Data Reviewed:      Latest Ref Rng & Units 04/28/2023    9:00 PM 04/01/2023    5:15 AM 03/31/2023   11:32 AM  CBC  WBC 4.0 - 10.5 K/uL 8.9  9.5  8.7   Hemoglobin 12.0 - 15.0 g/dL 21.3  08.6  57.8   Hematocrit 36.0 - 46.0 % 37.4  38.0  41.8   Platelets 150 - 400 K/uL 243  219  261       Latest Ref Rng & Units 04/29/2023    6:42 AM 04/28/2023    9:00 PM 04/01/2023    5:15 AM  BMP  Glucose 70 - 99 mg/dL 469  629  528   BUN 8 - 23 mg/dL 25  25  26    Creatinine 0.44 - 1.00 mg/dL 4.13  2.44  0.10   Sodium 135 - 145 mmol/L 139  137  136   Potassium 3.5 - 5.1 mmol/L 3.7  3.2  3.5   Chloride 98 - 111  mmol/L 106  103  106   CO2 22 - 32 mmol/L 24  23  21    Calcium 8.9 - 10.3 mg/dL 8.7  9.3  8.2    DG Chest Port 1 View Result Date: 04/28/2023 CLINICAL DATA:  Shortness of breath EXAM: PORTABLE CHEST 1 VIEW COMPARISON:  03/31/2023 FINDINGS: Stable cardiomegaly. Aortic atherosclerotic calcification. Pulmonary vascular congestion. Question infiltrates in the left costophrenic angle versus soft tissue overlap. No pleural effusion or pneumothorax. IMPRESSION: 1. Cardiomegaly with pulmonary vascular congestion. 2. Question infiltrates in the left costophrenic angle versus soft tissue overlap. Electronically Signed   By: Minerva Fester M.D.   On: 04/28/2023 21:38   Family Communication: Discussed with family at bedside they understand and agree. All questions  answereed.  Disposition: Status is: Inpatient Remains inpatient appropriate because: CHF, diuresis  Planned Discharge Destination: Rehab     Time spent: 39 minutes  Author: Marcelino Duster, MD 04/29/2023 3:44 PM Secure chat 7am to 7pm For on call review [email or web address].

## 2023-04-29 NOTE — ED Notes (Signed)
Purewick removed at this time.

## 2023-04-29 NOTE — ED Notes (Signed)
This tech and George Ina assisted pt to bedside commode. This tech, with the assistance of George Ina, provided peri care and placed pt in new brief. Nash Dimmer RN placed purewick back in place. Pt is resting comfortably with family at bedside.

## 2023-04-30 DIAGNOSIS — I5033 Acute on chronic diastolic (congestive) heart failure: Secondary | ICD-10-CM | POA: Diagnosis not present

## 2023-04-30 DIAGNOSIS — I480 Paroxysmal atrial fibrillation: Secondary | ICD-10-CM | POA: Diagnosis not present

## 2023-04-30 DIAGNOSIS — I10 Essential (primary) hypertension: Secondary | ICD-10-CM | POA: Diagnosis not present

## 2023-04-30 DIAGNOSIS — F418 Other specified anxiety disorders: Secondary | ICD-10-CM | POA: Diagnosis not present

## 2023-04-30 LAB — ECHOCARDIOGRAM COMPLETE
AR max vel: 1.92 cm2
AV Area VTI: 1.82 cm2
AV Area mean vel: 1.78 cm2
AV Mean grad: 2.5 mm[Hg]
AV Peak grad: 4.8 mm[Hg]
Ao pk vel: 1.1 m/s
Area-P 1/2: 3.06 cm2
Height: 65 in
MV M vel: 5.37 m/s
MV Peak grad: 115.3 mm[Hg]
MV VTI: 1.29 cm2
Radius: 0.4 cm
S' Lateral: 3.5 cm
Weight: 2720 [oz_av]

## 2023-04-30 MED ORDER — APIXABAN 5 MG PO TABS
5.0000 mg | ORAL_TABLET | Freq: Two times a day (BID) | ORAL | Status: DC
  Administered 2023-04-30 – 2023-05-02 (×4): 5 mg via ORAL
  Filled 2023-04-30 (×4): qty 1

## 2023-04-30 MED ORDER — QUETIAPINE FUMARATE 25 MG PO TABS: 25.0000 mg | ORAL_TABLET | Freq: Every day | ORAL | Status: DC | PRN

## 2023-04-30 MED ORDER — ENSURE ENLIVE PO LIQD
237.0000 mL | Freq: Two times a day (BID) | ORAL | Status: DC
  Administered 2023-04-30 – 2023-05-02 (×4): 237 mL via ORAL

## 2023-04-30 MED ORDER — ALUM & MAG HYDROXIDE-SIMETH 200-200-20 MG/5ML PO SUSP: 15.0000 mL | Freq: Four times a day (QID) | ORAL | Status: DC | PRN

## 2023-04-30 NOTE — Evaluation (Signed)
Occupational Therapy Evaluation Patient Details Name: Brooke Pierce MRN: [id number] DOB: Apr 25, 1931 Today's Date: 04/30/2023   History of Present Illness 89 y.o. female who presents with weakness and SOB. Pt is admitted for evaluation of diastolic CHF flareup. PMHx: HTN, HDL, dCHF, stroke, depression with anxiety, dementia, A-fib on Eliquis, RLS, hard of hearing, carotid artery stenosis, CKD-3A.   Clinical Impression   Pt was seen for OT evaluation this date. Prior to hospital admission, pt was residing at [facility] where she ambulated with a walker to the dining room and back on her own. Staff provided meals and meds. Her family would check in on her, but reports they cannot provide 24/7 care.   Pt presents to acute OT demonstrating impaired ADL performance and functional mobility 2/2 low activity tolerance, weakness, mild balance deficits and pain(See OT problem list for additional functional deficits). Pain reported in her R foot at 5th toe 6/10 with nurse notified to take a look.  Pt currently requires SUP/CGA for bed mobility and to perform LB dressing to don socks and shoes at EOB. Min A for STS from lowest height of bed to RW and CGA for SPT to recliner. Pt with notable DOE, however sp02 and HR stable. Biggest complaint was R toe pain. Family would like pt to go back to ILF with therapy services, pending progress with PT and OT in next day will determine SNF vs HH.  Pt would benefit from skilled OT services to address noted impairments and functional limitations (see below for any additional details) in order to maximize safety and independence while minimizing falls risk and caregiver burden. Do anticipate the need for follow up OT services upon acute hospital DC.        If plan is discharge home, recommend the following: A little help with walking and/or transfers;A little help with bathing/dressing/bathroom;Direct supervision/assist for financial management;Supervision due to  cognitive status;Assist for transportation;Help with stairs or ramp for entrance;Direct supervision/assist for medications management    Functional Status Assessment  Patient has had a recent decline in their functional status and demonstrates the ability to make significant improvements in function in a reasonable and predictable amount of time.  Equipment Recommendations  None recommended by OT    Recommendations for Other Services       Precautions / Restrictions Precautions Precautions: Fall Restrictions Weight Bearing Restrictions Per Provider Order: No      Mobility Bed Mobility Overal bed mobility: Needs Assistance Bed Mobility: Supine to Sit     Supine to sit: Supervision, Used rails, HOB elevated     General bed mobility comments: SUP for bed mobility    Transfers Overall transfer level: Needs assistance Equipment used: Rolling walker (2 wheels) Transfers: Sit to/from Stand, Bed to chair/wheelchair/BSC Sit to Stand: Min assist     Step pivot transfers: Contact guard assist     General transfer comment: Min A for STS from EOB and CGA for SPT to recliner      Balance Overall balance assessment: Needs assistance Sitting-balance support: Feet supported Sitting balance-Leahy Scale: Good Sitting balance - Comments: no LOB during LB dressing seated at EOB   Standing balance support: Bilateral upper extremity supported, During functional activity Standing balance-Leahy Scale: Fair Standing balance comment: CGA and dependent on RW for balance during transfer                           ADL either performed or assessed with  clinical judgement   ADL Overall ADL's : Needs assistance/impaired                     Lower Body Dressing: Sitting/lateral leans;Contact guard assist;Supervision/safety Lower Body Dressing Details (indicate cue type and reason): able to don socks and shoes seated at EOB with CGA/SBA Toilet Transfer: Rolling walker (2  wheels);Contact guard assist Toilet Transfer Details (indicate cue type and reason): simulated to recliner with CGA                 Vision         Perception         Praxis         Pertinent Vitals/Pain Pain Assessment Pain Assessment: 0-10 Pain Score: 6  Pain Location: 5th toe on R foot Pain Descriptors / Indicators: Aching, Sore Pain Intervention(s): Monitored during session, Repositioned (nurse notified)     Extremity/Trunk Assessment Upper Extremity Assessment Upper Extremity Assessment: Generalized weakness   Lower Extremity Assessment Lower Extremity Assessment: Generalized weakness       Communication Communication Cueing Techniques: Verbal cues;Tactile cues;Visual cues;Gestural cues   Cognition Arousal: Alert Behavior During Therapy: WFL for tasks assessed/performed Overall Cognitive Status: History of cognitive impairments - at baseline                                 General Comments: oriented to self only     General Comments  VSS; toe pain reported; notable DOE with sp02 stable    Exercises Other Exercises Other Exercises: Edu on role of OT in acute setting and importance of therapy to maximize safety/IND.   Shoulder Instructions      Home Living                                   Additional Comments: [place] [facility] [place] [facility]      Prior Functioning/Environment Prior Level of Function : Needs assist  Cognitive Assist : ADLs (cognitive);Mobility (cognitive) Mobility (Cognitive): Set up cues ADLs (Cognitive): Set up cues       Mobility Comments: ambulates with RW to dining hall; family reports fall recently reported, but no documented at facility so unsure if it happened? increased weakness the last few days with inability to amb distance to dining room ADLs Comments: assist for medication only, occassionally sponge bathes self but per family it is very difficult to get her to bathe        OT  Problem List: Decreased strength;Decreased activity tolerance;Impaired balance (sitting and/or standing)      OT Treatment/Interventions: Self-care/ADL training;Therapeutic exercise;Patient/family education;Balance training;Therapeutic activities    OT Goals(Current goals can be found in the care plan section) Acute Rehab OT Goals Patient Stated Goal: improve cognition and endurance OT Goal Formulation: With family Time For Goal Achievement: 05/14/23 Potential to Achieve Goals: Fair ADL Goals Pt Will Perform Grooming: with set-up;sitting;standing;with supervision Pt Will Perform Lower Body Bathing: with supervision;sitting/lateral leans;sit to/from stand Pt Will Perform Lower Body Dressing: sit to/from stand;sitting/lateral leans;with set-up Pt Will Transfer to Toilet: with supervision;regular height toilet (with LRAD) Pt Will Perform Toileting - Clothing Manipulation and hygiene: with modified independence;sitting/lateral leans;sit to/from stand  OT Frequency: Min 1X/week    Co-evaluation              AM-PAC OT "6 Clicks" Daily Activity  Outcome Measure Help from another person eating meals?: None Help from another person taking care of personal grooming?: None Help from another person toileting, which includes using toliet, bedpan, or urinal?: A Little Help from another person bathing (including washing, rinsing, drying)?: A Little Help from another person to put on and taking off regular upper body clothing?: None Help from another person to put on and taking off regular lower body clothing?: A Little 6 Click Score: 21   End of Session Equipment Utilized During Treatment: Rolling walker (2 wheels) Nurse Communication: Mobility status  Activity Tolerance: Patient tolerated treatment well;Patient limited by fatigue Patient left: in chair;with call bell/phone within reach;with chair alarm set;with family/visitor present  OT Visit Diagnosis: Other abnormalities of gait and  mobility (R26.89);Muscle weakness (generalized) (M62.81)                Time: 1610-9604 OT Time Calculation (min): 38 min Charges:  OT General Charges $OT Visit: 1 Visit OT Evaluation $OT Eval Moderate Complexity: 1 Mod OT Treatments $Self Care/Home Management : 8-22 mins $Therapeutic Activity: 8-22 mins Maddalynn Barnard, OTR/L 04/30/23, 12:20 PM  Kingdom Vanzanten E Raynald Rouillard 04/30/2023, 12:09 PM

## 2023-04-30 NOTE — Consult Note (Signed)
WOC Nurse Consult Note: Reason for Consult: Consult requested for right foot space between 4th and 5th toes; reported to have a scabbed wound.  Cleaned with skin prep and probed the location.  Loose brown material was removed and family member at the bedside assessed the location with a flashlight and agreed there is no wound.  Pt c/o pain at the site and there is moist maceration and constant pressure from the one toe rubbing above and crossing against the other.  Applied piece of cotton to absorb moisture and minimize pressure.  Topical treatment orders provided for bedside nurses to perform as follows to protect from further injury: uck piece of cotton or 2X2 gauze between 4th and 5th toe spaces Q day. Please re-consult if further assistance is needed.  Thank-you,  Cammie Mcgee MSN, RN, CWOCN, [place], CNS [phone]

## 2023-04-30 NOTE — Plan of Care (Signed)

## 2023-04-30 NOTE — Progress Notes (Signed)
Progress Note   Patient: Brooke Pierce RUE:[id number] DOB: 07/27/1931 DOA: 04/28/2023     2 DOS: the patient was seen and examined on 04/30/2023   Brief hospital course: TALEAH BELLANTONI is a 89 y.o. female with medical history significant of HTN, HDL, dCHF, stroke, depression with anxiety, dementia, A-fib on Eliquis, RLS, hard of hearing, carotid artery stenosis, CKD-3A, who presents with weakness and SOB.  Patient is admitted to the hospitalist service for evaluation of diastolic CHF flareup.  Assessment and Plan: Acute on chronic diastolic CHF- Continue IV Lasix 40 mg twice daily. 2D echocardiogram EF 50-55%, elevated PA pressures Daily weights, strict input and output. IV bronchodilators as needed ordered.  Mildly elevated troponin of 26- In the setting of CHF.  No chest pain. Continue home medications Plavix, Lipitor, Eliquis.  Paroxysmal atrial fibrillation- Heart rate well-controlled. Continue metoprolol, Eliquis therapy.  Hypertension- Patient will be continued on metoprolol, Cozaar therapy.  Hypokalemia- Oral potassium supplementation. Recheck and replete as needed.  Hyperlipidemia History of stroke Patient be continued on Lipitor, Plavix.  CKD stage IIIa- Stable renal function. Continue to monitor daily BMP as she is on diuretics.  Depression and anxiety- Continue home dose Seroquel, Remeron therapy.    Encourage oral diet, supplements. Out of bed to chair. Incentive spirometry. Nursing supportive care. Fall, aspiration precautions. DVT prophylaxis - on eliquis   Code Status: Limited: Do not attempt resuscitation (DNR) -DNR-LIMITED -Do Not Intubate/DNI   Subjective: Patient is seen and examined today morning.  She is lying comfortably, no complaints. Did not get out of bed. Eating fair.  Daughter at bedside.  Physical Exam: Vitals:   04/30/23 0917 04/30/23 1222 04/30/23 1322 04/30/23 1751  BP: (!) 154/47 (!) 99/57  (!) 146/57  Pulse: (!) 54 (!) 53   (!) 114  Resp:  18    Temp: 97.7 F (36.5 C)  97.9 F (36.6 C) 97.9 F (36.6 C)  TempSrc:   Axillary   SpO2: 95% 98%  96%  Weight:      Height:        General - Elderly ill Caucasian female, no apparent distress HEENT - PERRLA, EOMI, atraumatic head, non tender sinuses. Lung - Clear, diffuse rales, rhonchi, no wheezes. Heart - S1, S2 heard, no murmurs, rubs, 1+ pitting pedal edema. Abdomen - Soft, non tender non distended, bowel sounds good Neuro - Alert, awake and oriented x 3, non focal exam. Skin - Warm and dry.  Data Reviewed:      Latest Ref Rng & Units 04/28/2023    9:00 PM 04/01/2023    5:15 AM 03/31/2023   11:32 AM  CBC  WBC 4.0 - 10.5 K/uL 8.9  9.5  8.7   Hemoglobin 12.0 - 15.0 g/dL 14.7  82.9  56.2   Hematocrit 36.0 - 46.0 % 37.4  38.0  41.8   Platelets 150 - 400 K/uL 243  219  261       Latest Ref Rng & Units 04/29/2023    6:42 AM 04/28/2023    9:00 PM 04/01/2023    5:15 AM  BMP  Glucose 70 - 99 mg/dL 130  865  784   BUN 8 - 23 mg/dL 25  25  26    Creatinine 0.44 - 1.00 mg/dL 6.96  2.95  2.84   Sodium 135 - 145 mmol/L 139  137  136   Potassium 3.5 - 5.1 mmol/L 3.7  3.2  3.5   Chloride 98 - 111 mmol/L 106  103  106   CO2 22 - 32 mmol/L 24  23  21    Calcium 8.9 - 10.3 mg/dL 8.7  9.3  8.2    ECHOCARDIOGRAM COMPLETE Result Date: 04/30/2023    ECHOCARDIOGRAM REPORT   Patient Name:   Brooke Pierce Date of Exam: 04/29/2023 Medical Rec #:  [id number]        Height:       65.0 in Accession #:    [id number]       Weight:       170.0 lb Date of Birth:  1931/06/27        BSA:          1.846 m Patient Age:    89 years         BP:           143/59 mmHg Patient Gender: F                HR:           61 bpm. Exam Location:  [facility] Procedure: 2D Echo, Cardiac Doppler and Color Doppler Indications:     CHF  History:         Patient has prior history of Echocardiogram examinations, most                  recent 09/14/2021. CHF, Stroke, Arrythmias:Atrial Fibrillation,                   Signs/Symptoms:Altered Mental Status; Risk Factors:Hypertension                  and Dyslipidemia. CKD.  Sonographer:     Mikki Harbor Referring Phys:  [id number] Brien Few NIU Diagnosing Phys: Marcina Millard MD  Sonographer Comments: Suboptimal subcostal window. IMPRESSIONS  1. Left ventricular ejection fraction, by estimation, is 50 to 55%. The left ventricle has low normal function. The left ventricle has no regional wall motion abnormalities. Indeterminate diastolic filling due to E-A fusion.  2. Right ventricular systolic function is normal. The right ventricular size is normal. There is severely elevated pulmonary artery systolic pressure.  3. Left atrial size was moderately dilated.  4. The mitral valve is normal in structure. Moderate mitral valve regurgitation. No evidence of mitral stenosis.  5. Tricuspid valve regurgitation is mild to moderate.  6. The aortic valve is normal in structure. Aortic valve regurgitation is trivial. No aortic stenosis is present.  7. The inferior vena cava is normal in size with greater than 50% respiratory variability, suggesting right atrial pressure of 3 mmHg. FINDINGS  Left Ventricle: Left ventricular ejection fraction, by estimation, is 50 to 55%. The left ventricle has low normal function. The left ventricle has no regional wall motion abnormalities. The left ventricular internal cavity size was normal in size. There is no left ventricular hypertrophy. Indeterminate diastolic filling due to E-A fusion. Right Ventricle: The right ventricular size is normal. No increase in right ventricular wall thickness. Right ventricular systolic function is normal. There is severely elevated pulmonary artery systolic pressure. The tricuspid regurgitant velocity is 3.54 m/s, and with an assumed right atrial pressure of 15 mmHg, the estimated right ventricular systolic pressure is 65.1 mmHg. Left Atrium: Left atrial size was moderately dilated. Right Atrium: Right atrial size was normal  in size. Pericardium: There is no evidence of pericardial effusion. Mitral Valve: The mitral valve is normal in structure. Moderate mitral valve regurgitation. No evidence of mitral valve stenosis. MV peak gradient, 5.4 mmHg. The mean mitral valve  gradient is 2.0 mmHg. Tricuspid Valve: The tricuspid valve is normal in structure. Tricuspid valve regurgitation is mild to moderate. No evidence of tricuspid stenosis. Aortic Valve: The aortic valve is normal in structure. Aortic valve regurgitation is trivial. No aortic stenosis is present. Aortic valve mean gradient measures 2.5 mmHg. Aortic valve peak gradient measures 4.8 mmHg. Aortic valve area, by VTI measures 1.82 cm. Pulmonic Valve: The pulmonic valve was normal in structure. Pulmonic valve regurgitation is not visualized. No evidence of pulmonic stenosis. Aorta: The aortic root is normal in size and structure. Venous: The inferior vena cava is normal in size with greater than 50% respiratory variability, suggesting right atrial pressure of 3 mmHg. IAS/Shunts: No atrial level shunt detected by color flow Doppler.  LEFT VENTRICLE PLAX 2D LVIDd:         4.70 cm LVIDs:         3.50 cm LV PW:         1.10 cm LV IVS:        1.40 cm LVOT diam:     2.00 cm LV SV:         45 LV SV Index:   24 LVOT Area:     3.14 cm  RIGHT VENTRICLE RV Basal diam:  3.70 cm RV Mid diam:    3.10 cm RV S prime:     8.16 cm/s TAPSE (M-mode): 2.0 cm LEFT ATRIUM             Index        RIGHT ATRIUM           Index LA diam:        4.50 cm 2.44 cm/m   RA Area:     21.30 cm LA Vol (A2C):   73.2 ml 39.65 ml/m  RA Volume:   66.80 ml  36.18 ml/m LA Vol (A4C):   70.1 ml 37.97 ml/m LA Biplane Vol: 77.2 ml 41.82 ml/m  AORTIC VALVE                    PULMONIC VALVE AV Area (Vmax):    1.92 cm     PV Vmax:       0.68 m/s AV Area (Vmean):   1.78 cm     PV Peak grad:  1.9 mmHg AV Area (VTI):     1.82 cm AV Vmax:           110.00 cm/s AV Vmean:          72.350 cm/s AV VTI:            0.244 m AV  Peak Grad:      4.8 mmHg AV Mean Grad:      2.5 mmHg LVOT Vmax:         67.20 cm/s LVOT Vmean:        41.000 cm/s LVOT VTI:          0.142 m LVOT/AV VTI ratio: 0.58  AORTA Ao Root diam: 3.10 cm MITRAL VALVE                  TRICUSPID VALVE MV Area (PHT): 3.06 cm       TR Peak grad:   50.1 mmHg MV Area VTI:   1.29 cm       TR Vmax:        354.00 cm/s MV Peak grad:  5.4 mmHg MV Mean grad:  2.0 mmHg       SHUNTS MV Vmax:  1.16 m/s       Systemic VTI:  0.14 m MV Vmean:      56.2 cm/s      Systemic Diam: 2.00 cm MV Decel Time: 248 msec MR Peak grad:    115.3 mmHg MR Mean grad:    76.0 mmHg MR Vmax:         537.00 cm/s MR Vmean:        414.0 cm/s MR PISA:         1.01 cm MR PISA Eff ROA: 17 mm MR PISA Radius:  0.40 cm MV E velocity: 96.40 cm/s Marcina Millard MD Electronically signed by Marcina Millard MD Signature Date/Time: 04/30/2023/1:22:07 PM    Final    DG Chest Port 1 View Result Date: 04/28/2023 CLINICAL DATA:  Shortness of breath EXAM: PORTABLE CHEST 1 VIEW COMPARISON:  03/31/2023 FINDINGS: Stable cardiomegaly. Aortic atherosclerotic calcification. Pulmonary vascular congestion. Question infiltrates in the left costophrenic angle versus soft tissue overlap. No pleural effusion or pneumothorax. IMPRESSION: 1. Cardiomegaly with pulmonary vascular congestion. 2. Question infiltrates in the left costophrenic angle versus soft tissue overlap. Electronically Signed   By: Minerva Fester M.D.   On: 04/28/2023 21:38   Family Communication: Discussed with patient, daughter at bedside they understand and agree. All questions answereed.  Disposition: Status is: Inpatient Remains inpatient appropriate because: CHF, diuresis  Planned Discharge Destination: Rehab vs HH     Time spent: 37 minutes  Author: Marcelino Duster, MD 04/30/2023 8:16 PM Secure chat 7am to 7pm For on call review [email or web address].

## 2023-04-30 NOTE — Evaluation (Signed)
Physical Therapy Evaluation Patient Details Name: Brooke Pierce MRN: [id number] DOB: 1932-01-21 Today's Date: 04/30/2023  History of Present Illness  89 y.o. female who presents with weakness and SOB. Pt is admitted for evaluation of diastolic CHF flareup. PMHx: HTN, HDL, dCHF, stroke, depression with anxiety, dementia, A-fib on Eliquis, RLS, hard of hearing, carotid artery stenosis, CKD-3A.  Clinical Impression  Patient resting in bed upon arrival to room; supportive daughter at bedside.  Patient alert and oriented to self only; follows most simple commands.  Does intermittent require use of gestures and hand-over-hand for full comprehension/initiation (limited by both cognition and hearing deficits). Denies pain; foot pain address/resolved by wound care RN earlier in the day.  Bilat UE/LE strength generally weak and deconditioned, but grossly functional for basic transfer and mobility tasks.  No focal weakness appreciated.  Able to complete bed mobility with min assist; sit/stand, standing balance and basic transfers with RW, cga/min assist.  Does require frequent cuing for safety and overall problem-solving, sequencing of functional tasks.  Additional gait distance deferred this date due to toileting needs; will continue to assess/progress as appropriate in subsequent sessions. Does fatigue quickly with minimal activity; vitals stable and WFL on RA throughout. Would benefit from skilled PT to address above deficits and promote optimal return to PLOF.; recommend post-acute PT follow up as indicated by interdisciplinary care team.     If ALF able to provide light +1 with basic transfers and short-distance gait (WC level for longer distances), would optimally benefit from return to familiar environment, routine.  However, if facility unable to provide this level of care, may consider transition to STR for continued therapy prior to return home.  Daughter voices definite preference for return to ALF if  at all possible.      If plan is discharge home, recommend the following: A little help with walking and/or transfers;A little help with bathing/dressing/bathroom   Can travel by private vehicle   Yes    Equipment Recommendations    Recommendations for Other Services       Functional Status Assessment Patient has had a recent decline in their functional status and demonstrates the ability to make significant improvements in function in a reasonable and predictable amount of time.     Precautions / Restrictions Precautions Precautions: Fall Restrictions Weight Bearing Restrictions Per Provider Order: No      Mobility  Bed Mobility Overal bed mobility: Needs Assistance Bed Mobility: Supine to Sit, Sit to Supine     Supine to sit: Min assist Sit to supine: Supervision        Transfers Overall transfer level: Needs assistance Equipment used: Rolling walker (2 wheels) Transfers: Sit to/from Stand, Bed to chair/wheelchair/BSC Sit to Stand: Contact guard assist, Min assist Stand pivot transfers: Min assist, Contact guard assist              Ambulation/Gait               General Gait Details: deferred due to toileting needs during session  Stairs            Wheelchair Mobility     Tilt Bed    Modified Rankin (Stroke Patients Only)       Balance Overall balance assessment: Needs assistance Sitting-balance support: No upper extremity supported, Feet supported Sitting balance-Leahy Scale: Good     Standing balance support: Bilateral upper extremity supported Standing balance-Leahy Scale: Fair  Pertinent Vitals/Pain Pain Assessment Pain Assessment: Faces Faces Pain Scale: No hurt    Home Living Family/patient expects to be discharged to:: Assisted living                 Home Equipment: Agricultural consultant (2 wheels) Additional Comments: Resident of the [employer]    Prior Function  Prior Level of Function : Needs assist             Mobility Comments: Supervision with RW for household mobilization; ambulates to/from dining hall for meals       Extremity/Trunk Assessment   Upper Extremity Assessment Upper Extremity Assessment: Overall WFL for tasks assessed    Lower Extremity Assessment Lower Extremity Assessment: Generalized weakness (grossly at least 4-/5 throughout)       Communication   Communication Communication: Hearing impairment  Cognition Arousal: Alert Behavior During Therapy: WFL for tasks assessed/performed Overall Cognitive Status: History of cognitive impairments - at baseline                                 General Comments: oriented to self only; inconsistently follows simple commands, but responds well to gestures, hand-over-hand        General Comments      Exercises Other Exercises Other Exercises: Toilet transfer, SPT with RW, cga/min assist for balance.  Sit/stand from [facility] with RW, cga/min assist; standing balance for hygiene, cga/min assist.  Max assist for hygiene to ensure thoroughness.   Assessment/Plan    PT Assessment Patient needs continued PT services  PT Problem List Decreased strength;Decreased range of motion;Decreased activity tolerance;Decreased balance;Decreased mobility;Decreased cognition;Decreased knowledge of use of DME;Decreased safety awareness;Decreased knowledge of precautions;Cardiopulmonary status limiting activity       PT Treatment Interventions DME instruction;Gait training;Stair training;Functional mobility training;Therapeutic exercise;Therapeutic activities;Balance training;Patient/family education;Cognitive remediation    PT Goals (Current goals can be found in the Care Plan section)  Acute Rehab PT Goals Patient Stated Goal: to stay at ALF if possible PT Goal Formulation: With patient/family Time For Goal Achievement: 05/14/23 Potential to Achieve Goals: Good    Frequency  Min 1X/week     Co-evaluation               AM-PAC PT "6 Clicks" Mobility  Outcome Measure Help needed turning from your back to your side while in a flat bed without using bedrails?: None Help needed moving from lying on your back to sitting on the side of a flat bed without using bedrails?: A Little Help needed moving to and from a bed to a chair (including a wheelchair)?: A Little Help needed standing up from a chair using your arms (e.g., wheelchair or bedside chair)?: A Little Help needed to walk in hospital room?: A Little Help needed climbing 3-5 steps with a railing? : A Little 6 Click Score: 19    End of Session   Activity Tolerance: Patient tolerated treatment well Patient left: in bed;with call bell/phone within reach;with bed alarm set Nurse Communication: Mobility status PT Visit Diagnosis: Difficulty in walking, not elsewhere classified (R26.2);Muscle weakness (generalized) (M62.81)    Time: 1449-1510 PT Time Calculation (min) (ACUTE ONLY): 21 min   Charges:   PT Evaluation $PT Eval Moderate Complexity: 1 Mod PT Treatments $Therapeutic Activity: 8-22 mins PT General Charges $$ ACUTE PT VISIT: 1 Visit         Eduarda Scrivens H. Manson Passey, PT, DPT, NCS 04/30/23, 11:32 PM [phone]

## 2023-05-01 DIAGNOSIS — I5033 Acute on chronic diastolic (congestive) heart failure: Secondary | ICD-10-CM | POA: Diagnosis not present

## 2023-05-01 DIAGNOSIS — I480 Paroxysmal atrial fibrillation: Secondary | ICD-10-CM | POA: Diagnosis not present

## 2023-05-01 DIAGNOSIS — I10 Essential (primary) hypertension: Secondary | ICD-10-CM | POA: Diagnosis not present

## 2023-05-01 DIAGNOSIS — F418 Other specified anxiety disorders: Secondary | ICD-10-CM | POA: Diagnosis not present

## 2023-05-01 LAB — BASIC METABOLIC PANEL
Anion gap: 12 (ref 5–15)
BUN: 27 mg/dL — ABNORMAL HIGH (ref 8–23)
CO2: 29 mmol/L (ref 22–32)
Calcium: 9 mg/dL (ref 8.9–10.3)
Chloride: 101 mmol/L (ref 98–111)
Creatinine, Ser: 1.31 mg/dL — ABNORMAL HIGH (ref 0.44–1.00)
GFR, Estimated: 39 mL/min — ABNORMAL LOW (ref >60)
Glucose, Bld: 148 mg/dL — ABNORMAL HIGH (ref 70–99)
Potassium: 3.4 mmol/L — ABNORMAL LOW (ref 3.5–5.1)
Sodium: 142 mmol/L (ref 135–145)

## 2023-05-01 MED ORDER — FUROSEMIDE 40 MG PO TABS
40.0000 mg | ORAL_TABLET | Freq: Every day | ORAL | Status: DC
  Administered 2023-05-02: 40 mg via ORAL
  Filled 2023-05-01: qty 1

## 2023-05-01 MED ORDER — POTASSIUM CHLORIDE 20 MEQ PO PACK
40.0000 meq | PACK | Freq: Once | ORAL | Status: AC
  Administered 2023-05-01: 40 meq via ORAL
  Filled 2023-05-01: qty 2

## 2023-05-01 NOTE — TOC Initial Note (Signed)
Transition of Care [facility]) - Initial/Assessment Note    Patient Details  Name: Brooke Pierce MRN: [id number] Date of Birth: Nov 25, 1931  [facility]) CM/SW Contact:    Margarito Liner, LCSW Phone Number: 05/01/2023, 3:15 PM  Clinical Narrative: CSW met with patient. HCPOA Annette at bedside. CSW introduced role and explained that discharge planning would be discussed. Patient is a resident at [facility]. She is active with [facility] for PT, OT, RN. [facility] confirmed they can accept her back if she needs 1+ assist because this is the level of care she needed prior to admission. HCPOA agrees that mentally it would be best if she returned to [facility] at discharge. Discussed palliative vs hospice when she returns. MD has placed a palliative consult. The [facility] confirmed they can take her back with either. No further concerns. CSW will continue to follow patient and her HCPOA for support and faciltate return to [facility] once medically stable. Drinda Butts will likely transport her at discharge.                  Expected Discharge Plan: Assisted Living (with home health) Barriers to Discharge: Continued Medical Work up   Patient Goals and CMS Choice            Expected Discharge Plan and Services     Post Acute Care Choice: Resumption of Svcs/PTA Provider Living arrangements for the past 2 months: Assisted Living Facility                           HH Arranged: RN, PT, OT [facility] Agency: [facility] Date [facility] Agency Contacted: 05/01/23   Representative spoke with at [facility] Agency: Velna Hatchet  Prior Living Arrangements/Services Living arrangements for the past 2 months: Assisted Living Facility Lives with:: Facility Resident Patient language and need for interpreter reviewed:: Yes Do you feel safe going back to the place where you live?: Yes      Need for Family Participation in Patient Care: Yes (Comment) Care giver support system in place?: Yes (comment)    Criminal Activity/Legal Involvement Pertinent to Current Situation/Hospitalization: No - Comment as needed  Activities of Daily Living   ADL Screening (condition at time of admission) Independently performs ADLs?: No Does the patient have a NEW difficulty with bathing/dressing/toileting/self-feeding that is expected to last >3 days?: Yes (Initiates electronic notice to provider for possible OT consult) Does the patient have a NEW difficulty with getting in/out of bed, walking, or climbing stairs that is expected to last >3 days?: Yes (Initiates electronic notice to provider for possible PT consult) Does the patient have a NEW difficulty with communication that is expected to last >3 days?: No Is the patient deaf or have difficulty hearing?: No Does the patient have difficulty seeing, even when wearing glasses/contacts?: No Does the patient have difficulty concentrating, remembering, or making decisions?: Yes  Permission Sought/Granted Permission sought to share information with : Facility Medical sales representative, Other (comment) (HCPOA)    Share Information with NAME: Frankey Shown  Permission granted to share info w AGENCY: [facility]  Permission granted to share info w Relationship: HCPOA  Permission granted to share info w Contact Information: [phone]  Emotional Assessment Appearance:: Appears stated age Attitude/Demeanor/Rapport: Unable to Assess Affect (typically observed): Unable to Assess Orientation: : Oriented to Self, Oriented to Place Alcohol / Substance Use: Not Applicable Psych Involvement: No (comment)  Admission diagnosis:  Acute on chronic diastolic CHF (congestive heart failure) (HCC) [I50.33] Acute on chronic diastolic (congestive) heart failure (HCC) [I50.33] Acute on chronic congestive heart failure, unspecified heart failure type [facility]) [I50.9] Patient Active Problem List   Diagnosis Date Noted   Acute on chronic diastolic (congestive) heart  failure (HCC) 04/28/2023   Overweight (BMI 25.0-29.9) 04/28/2023   Chronic kidney disease, stage 3a (HCC) 04/28/2023   Acute on chronic diastolic CHF (congestive heart failure) (HCC) 04/28/2023   Myocardial injury 04/28/2023   Atrial fibrillation with RVR (HCC) 03/31/2023   UTI (urinary tract infection) 03/31/2023   Chronic diastolic CHF (congestive heart failure) (HCC) 03/31/2023   HLD (hyperlipidemia) 03/31/2023   Depression with anxiety 03/31/2023   Acute metabolic encephalopathy 03/31/2023   Hypokalemia 03/27/2023   Lactic acidosis 03/27/2023   Gastroenteritis 03/26/2023   Paroxysmal A-fib (HCC) 03/26/2023   AKI (acute kidney injury) (HCC) 03/26/2023   Stroke (HCC) 09/14/2021   Hypertension 09/14/2021   Dementia (HCC) 09/14/2021   PCP:  Bryson Corona, NP Pharmacy:   [facility], [place] - [address] [place] [address] Phone: [phone] Fax: [phone]  [id number] - [place] [facility] - [address] [place] [address] Phone: [phone] Fax: [phone]     Social Drivers of Health (SDOH) Social History: SDOH Screenings   Food Insecurity: No Food Insecurity (04/29/2023)  Housing: Low Risk  (04/29/2023)  Transportation Needs: No Transportation Needs (04/29/2023)  Utilities: Not At Risk (04/29/2023)  Financial Resource Strain: Low Risk  (08/14/2022)   Received from [facility]  Social Connections: Moderately Integrated (04/29/2023)  Tobacco Use: Low Risk  (04/29/2023)   SDOH Interventions:     Readmission Risk Interventions     No data to display

## 2023-05-01 NOTE — Progress Notes (Signed)
Progress Note   Patient: Brooke Pierce BJY:[id number] DOB: [birth year] DOA: 04/28/2023     3 DOS: the patient was seen and examined on 05/01/2023   Brief hospital course: Brooke Pierce is a 89 y.o. female with medical history significant of HTN, HDL, dCHF, stroke, depression with anxiety, dementia, A-fib on Eliquis, RLS, hard of hearing, carotid artery stenosis, CKD-3A, who presents with weakness and SOB.  Patient is admitted to the hospitalist service for evaluation of diastolic CHF flareup.  Assessment and Plan: Acute on chronic diastolic CHF- Will transition IV Lasix twice daily to oral 40mg  daily, 2D echocardiogram EF 50-55%, elevated PA pressures Daily weights, strict input and output. IV bronchodilators as needed ordered. Await PT/ OT for safe dc plan.  Mildly elevated troponin- In the setting of CHF.  No chest pain. Continue home medications Plavix, Lipitor, Eliquis.  Paroxysmal atrial fibrillation- Heart rate well-controlled. Continue metoprolol, Eliquis therapy.  Hypertension- Patient will be continued on metoprolol, Cozaar therapy. Caution with borderline low BP.  Hypokalemia- Oral potassium supplementation. Recheck and replete as needed.  Hyperlipidemia History of stroke Patient be continued on Lipitor, Plavix.  CKD stage IIIa- Stable renal function. Continue to monitor daily BMP as she is on diuretics.  Depression and anxiety- Continue home dose Seroquel, Remeron therapy.    Encourage oral diet, supplements. Out of bed to chair. Incentive spirometry. Nursing supportive care. Fall, aspiration precautions. DVT prophylaxis - on eliquis   Code Status: Limited: Do not attempt resuscitation (DNR) -DNR-LIMITED -Do Not Intubate/DNI   Subjective: Patient is seen and examined today morning.  She is lying comfortably, no complaints. Sister at bedside, daughter over phone asked about dc plan. She is eating fair, weak working with PT.  Physical Exam: Vitals:    05/01/23 0413 05/01/23 0500 05/01/23 0754 05/01/23 1116  BP: 117/70  (!) 118/103 111/66  Pulse: 71  86 86  Resp: 20  18 18   Temp: 98.7 F (37.1 C)  98 F (36.7 C) 98 F (36.7 C)  TempSrc:   Oral   SpO2: 98%  96% 93%  Weight:  74.5 kg    Height:        General - Elderly ill Caucasian female, no apparent distress HEENT - PERRLA, EOMI, atraumatic head, non tender sinuses. Lung - Clear, diffuse rales, rhonchi, no wheezes. Heart - S1, S2 heard, no murmurs, rubs, trace pitting pedal edema. Abdomen - Soft, non tender non distended, bowel sounds good Neuro - Alert, awake and oriented x 3, non focal exam. Skin - Warm and dry.  Data Reviewed:      Latest Ref Rng & Units 04/28/2023    9:00 PM 04/01/2023    5:15 AM 03/31/2023   11:32 AM  CBC  WBC 4.0 - 10.5 K/uL 8.9  9.5  8.7   Hemoglobin 12.0 - 15.0 g/dL 08.6  57.8  46.9   Hematocrit 36.0 - 46.0 % 37.4  38.0  41.8   Platelets 150 - 400 K/uL 243  219  261       Latest Ref Rng & Units 05/01/2023    2:08 PM 04/29/2023    6:42 AM 04/28/2023    9:00 PM  BMP  Glucose 70 - 99 mg/dL 629  528  413   BUN 8 - 23 mg/dL 27  25  25    Creatinine 0.44 - 1.00 mg/dL 2.44  0.10  2.72   Sodium 135 - 145 mmol/L 142  139  137   Potassium 3.5 - 5.1 mmol/L  3.4  3.7  3.2   Chloride 98 - 111 mmol/L 101  106  103   CO2 22 - 32 mmol/L 29  24  23    Calcium 8.9 - 10.3 mg/dL 9.0  8.7  9.3    ECHOCARDIOGRAM COMPLETE Result Date: 04/30/2023    ECHOCARDIOGRAM REPORT   Patient Name:   Brooke Pierce Date of Exam: 04/29/2023 Medical Rec #:  [id number]        Height:       65.0 in Accession #:    [id number]       Weight:       170.0 lb Date of Birth:  Nov 16, 1931        BSA:          1.846 m Patient Age:    89 years         BP:           143/59 mmHg Patient Gender: F                HR:           61 bpm. Exam Location:  [facility] Procedure: 2D Echo, Cardiac Doppler and Color Doppler Indications:     CHF  History:         Patient has prior history of Echocardiogram  examinations, most                  recent 09/14/2021. CHF, Stroke, Arrythmias:Atrial Fibrillation,                  Signs/Symptoms:Altered Mental Status; Risk Factors:Hypertension                  and Dyslipidemia. CKD.  Sonographer:     Mikki Harbor Referring Phys:  [id number] Brien Few NIU Diagnosing Phys: Marcina Millard MD  Sonographer Comments: Suboptimal subcostal window. IMPRESSIONS  1. Left ventricular ejection fraction, by estimation, is 50 to 55%. The left ventricle has low normal function. The left ventricle has no regional wall motion abnormalities. Indeterminate diastolic filling due to E-A fusion.  2. Right ventricular systolic function is normal. The right ventricular size is normal. There is severely elevated pulmonary artery systolic pressure.  3. Left atrial size was moderately dilated.  4. The mitral valve is normal in structure. Moderate mitral valve regurgitation. No evidence of mitral stenosis.  5. Tricuspid valve regurgitation is mild to moderate.  6. The aortic valve is normal in structure. Aortic valve regurgitation is trivial. No aortic stenosis is present.  7. The inferior vena cava is normal in size with greater than 50% respiratory variability, suggesting right atrial pressure of 3 mmHg. FINDINGS  Left Ventricle: Left ventricular ejection fraction, by estimation, is 50 to 55%. The left ventricle has low normal function. The left ventricle has no regional wall motion abnormalities. The left ventricular internal cavity size was normal in size. There is no left ventricular hypertrophy. Indeterminate diastolic filling due to E-A fusion. Right Ventricle: The right ventricular size is normal. No increase in right ventricular wall thickness. Right ventricular systolic function is normal. There is severely elevated pulmonary artery systolic pressure. The tricuspid regurgitant velocity is 3.54 m/s, and with an assumed right atrial pressure of 15 mmHg, the estimated right ventricular systolic  pressure is 65.1 mmHg. Left Atrium: Left atrial size was moderately dilated. Right Atrium: Right atrial size was normal in size. Pericardium: There is no evidence of pericardial effusion. Mitral Valve: The mitral valve is normal in structure. Moderate mitral valve  regurgitation. No evidence of mitral valve stenosis. MV peak gradient, 5.4 mmHg. The mean mitral valve gradient is 2.0 mmHg. Tricuspid Valve: The tricuspid valve is normal in structure. Tricuspid valve regurgitation is mild to moderate. No evidence of tricuspid stenosis. Aortic Valve: The aortic valve is normal in structure. Aortic valve regurgitation is trivial. No aortic stenosis is present. Aortic valve mean gradient measures 2.5 mmHg. Aortic valve peak gradient measures 4.8 mmHg. Aortic valve area, by VTI measures 1.82 cm. Pulmonic Valve: The pulmonic valve was normal in structure. Pulmonic valve regurgitation is not visualized. No evidence of pulmonic stenosis. Aorta: The aortic root is normal in size and structure. Venous: The inferior vena cava is normal in size with greater than 50% respiratory variability, suggesting right atrial pressure of 3 mmHg. IAS/Shunts: No atrial level shunt detected by color flow Doppler.  LEFT VENTRICLE PLAX 2D LVIDd:         4.70 cm LVIDs:         3.50 cm LV PW:         1.10 cm LV IVS:        1.40 cm LVOT diam:     2.00 cm LV SV:         45 LV SV Index:   24 LVOT Area:     3.14 cm  RIGHT VENTRICLE RV Basal diam:  3.70 cm RV Mid diam:    3.10 cm RV S prime:     8.16 cm/s TAPSE (M-mode): 2.0 cm LEFT ATRIUM             Index        RIGHT ATRIUM           Index LA diam:        4.50 cm 2.44 cm/m   RA Area:     21.30 cm LA Vol (A2C):   73.2 ml 39.65 ml/m  RA Volume:   66.80 ml  36.18 ml/m LA Vol (A4C):   70.1 ml 37.97 ml/m LA Biplane Vol: 77.2 ml 41.82 ml/m  AORTIC VALVE                    PULMONIC VALVE AV Area (Vmax):    1.92 cm     PV Vmax:       0.68 m/s AV Area (Vmean):   1.78 cm     PV Peak grad:  1.9 mmHg AV  Area (VTI):     1.82 cm AV Vmax:           110.00 cm/s AV Vmean:          72.350 cm/s AV VTI:            0.244 m AV Peak Grad:      4.8 mmHg AV Mean Grad:      2.5 mmHg LVOT Vmax:         67.20 cm/s LVOT Vmean:        41.000 cm/s LVOT VTI:          0.142 m LVOT/AV VTI ratio: 0.58  AORTA Ao Root diam: 3.10 cm MITRAL VALVE                  TRICUSPID VALVE MV Area (PHT): 3.06 cm       TR Peak grad:   50.1 mmHg MV Area VTI:   1.29 cm       TR Vmax:        354.00 cm/s MV Peak grad:  5.4  mmHg MV Mean grad:  2.0 mmHg       SHUNTS MV Vmax:       1.16 m/s       Systemic VTI:  0.14 m MV Vmean:      56.2 cm/s      Systemic Diam: 2.00 cm MV Decel Time: 248 msec MR Peak grad:    115.3 mmHg MR Mean grad:    76.0 mmHg MR Vmax:         537.00 cm/s MR Vmean:        414.0 cm/s MR PISA:         1.01 cm MR PISA Eff ROA: 17 mm MR PISA Radius:  0.40 cm MV E velocity: 96.40 cm/s Marcina Millard MD Electronically signed by Marcina Millard MD Signature Date/Time: 04/30/2023/1:22:07 PM    Final    Family Communication: Discussed with patient, family they understand and agree. All questions answereed.  Disposition: Status is: Inpatient Remains inpatient appropriate because: CHF, diuresis  Planned Discharge Destination: Rehab vs HH     Time spent: 38 minutes  Author: Marcelino Duster, MD 05/01/2023 3:09 PM Secure chat 7am to 7pm For on call review [email or web address].

## 2023-05-01 NOTE — Care Management Important Message (Signed)
Important Message  Patient Details  Name: Brooke Pierce MRN: [id number] Date of Birth: 11-26-1931   Important Message Given:  Yes - Medicare IM     Sherilyn Banker 05/01/2023, 12:36 PM

## 2023-05-02 DIAGNOSIS — Z515 Encounter for palliative care: Secondary | ICD-10-CM | POA: Diagnosis not present

## 2023-05-02 DIAGNOSIS — I5A Non-ischemic myocardial injury (non-traumatic): Secondary | ICD-10-CM | POA: Diagnosis not present

## 2023-05-02 DIAGNOSIS — I5033 Acute on chronic diastolic (congestive) heart failure: Secondary | ICD-10-CM | POA: Diagnosis not present

## 2023-05-02 DIAGNOSIS — E663 Overweight: Secondary | ICD-10-CM | POA: Diagnosis not present

## 2023-05-02 DIAGNOSIS — N1831 Chronic kidney disease, stage 3a: Secondary | ICD-10-CM | POA: Diagnosis not present

## 2023-05-02 DIAGNOSIS — I10 Essential (primary) hypertension: Secondary | ICD-10-CM | POA: Diagnosis not present

## 2023-05-02 LAB — BASIC METABOLIC PANEL
Anion gap: 12 (ref 5–15)
BUN: 27 mg/dL — ABNORMAL HIGH (ref 8–23)
CO2: 28 mmol/L (ref 22–32)
Calcium: 8.9 mg/dL (ref 8.9–10.3)
Chloride: 102 mmol/L (ref 98–111)
Creatinine, Ser: 1.2 mg/dL — ABNORMAL HIGH (ref 0.44–1.00)
GFR, Estimated: 43 mL/min — ABNORMAL LOW (ref >60)
Glucose, Bld: 114 mg/dL — ABNORMAL HIGH (ref 70–99)
Potassium: 3.3 mmol/L — ABNORMAL LOW (ref 3.5–5.1)
Sodium: 142 mmol/L (ref 135–145)

## 2023-05-02 MED ORDER — FUROSEMIDE 20 MG PO TABS: 40.0000 mg | ORAL_TABLET | Freq: Every day | ORAL | 2 refills | Status: AC

## 2023-05-02 MED ORDER — POLYVINYL ALCOHOL 1.4 % OP SOLN: 1.0000 [drp] | OPHTHALMIC | Status: DC | PRN

## 2023-05-02 NOTE — Plan of Care (Signed)
  Problem: Health Behavior/Discharge Planning: Goal: Ability to manage health-related needs will improve Outcome: Progressing   Problem: Coping: Goal: Level of anxiety will decrease Outcome: Progressing   Problem: Pain Managment: Goal: General experience of comfort will improve and/or be controlled Outcome: Progressing   Problem: Safety: Goal: Ability to remain free from injury will improve Outcome: Progressing

## 2023-05-02 NOTE — Progress Notes (Signed)
Physical Therapy Treatment Patient Details Name: Brooke Pierce MRN: [id number] DOB: [birth year] Today's Date: 05/02/2023   History of Present Illness 89 y.o. female who presents with weakness and SOB. Pt is admitted for evaluation of diastolic CHF flareup. PMHx: HTN, HDL, dCHF, stroke, depression with anxiety, dementia, A-fib on Eliquis, RLS, hard of hearing, carotid artery stenosis, CKD-3A.    PT Comments  Pt resting in bed upon PT arrival; pt agreeable to therapy; family member present during session.  Mild R toe pain reported during session.  During session pt SBA semi-supine to sitting EOB; SBA with transfer; and CGA ambulating 100 feet with RW use.  Pt steady with functional mobility using RW during session.  SOB noted with increased distance ambulating (SpO2 sats 97% or greater on room air during sessions activities).  Family member initially concerned pt might need manual w/c for discharge but now feels pt does not need one based on pt's functional mobility today.  TOC updated on pt's status and recommendations.    If plan is discharge home, recommend the following: A little help with walking and/or transfers;A little help with bathing/dressing/bathroom   Can travel by private vehicle     Yes  Equipment Recommendations  Rolling walker (2 wheels) (pt has RW at home already)    Recommendations for Other Services       Precautions / Restrictions Precautions Precautions: Fall Restrictions Weight Bearing Restrictions Per Provider Order: No     Mobility  Bed Mobility Overal bed mobility: Needs Assistance Bed Mobility: Supine to Sit     Supine to sit: Supervision, HOB elevated          Transfers Overall transfer level: Needs assistance Equipment used: Rolling walker (2 wheels) Transfers: Sit to/from Stand Sit to Stand: Supervision           General transfer comment: steady standing from bed    Ambulation/Gait Ambulation/Gait assistance: Contact guard  assist Gait Distance (Feet): 100 Feet Assistive device: Rolling walker (2 wheels) Gait Pattern/deviations: Step-through pattern, Decreased step length - right, Decreased step length - left Gait velocity: decreased     General Gait Details: steady ambulating with RW use   Stairs             Wheelchair Mobility     Tilt Bed    Modified Rankin (Stroke Patients Only)       Balance Overall balance assessment: Needs assistance Sitting-balance support: No upper extremity supported, Feet supported Sitting balance-Leahy Scale: Normal Sitting balance - Comments: steady reaching outside BOS   Standing balance support: Bilateral upper extremity supported, During functional activity, Reliant on assistive device for balance Standing balance-Leahy Scale: Good Standing balance comment: steady ambulating with RW use                            Cognition Arousal: Alert Behavior During Therapy: WFL for tasks assessed/performed Overall Cognitive Status: History of cognitive impairments - at baseline                                 General Comments: Oriented to self only; inconsistent with following simple commands (anticipate d/t HOH but did well with gestures and hand-over hand cueing)        Exercises      General Comments        Pertinent Vitals/Pain Pain Assessment Pain Assessment: Faces Faces Pain Scale: Hurts a  little bit Pain Location: 5th toe on R foot Pain Descriptors / Indicators: Aching, Sore Pain Intervention(s): Limited activity within patient's tolerance, Monitored during session HR stable during session.    Home Living                          Prior Function            PT Goals (current goals can now be found in the care plan section) Acute Rehab PT Goals Patient Stated Goal: to stay at ALF if possible PT Goal Formulation: With patient/family Time For Goal Achievement: 05/14/23 Potential to Achieve Goals:  Good Progress towards PT goals: Progressing toward goals    Frequency    Min 1X/week      PT Plan      Co-evaluation              AM-PAC PT "6 Clicks" Mobility   Outcome Measure  Help needed turning from your back to your side while in a flat bed without using bedrails?: None Help needed moving from lying on your back to sitting on the side of a flat bed without using bedrails?: A Little Help needed moving to and from a bed to a chair (including a wheelchair)?: A Little Help needed standing up from a chair using your arms (e.g., wheelchair or bedside chair)?: A Little Help needed to walk in hospital room?: A Little Help needed climbing 3-5 steps with a railing? : A Little 6 Click Score: 19    End of Session Equipment Utilized During Treatment: Gait belt Activity Tolerance: Patient tolerated treatment well Patient left: in chair;with call bell/phone within reach;with chair alarm set;with family/visitor present;Other (comment) (B heels floating via pillow support) Nurse Communication: Mobility status;Precautions PT Visit Diagnosis: Difficulty in walking, not elsewhere classified (R26.2);Muscle weakness (generalized) (M62.81)     Time: 2952-8413 PT Time Calculation (min) (ACUTE ONLY): 23 min  Charges:    $Gait Training: 8-22 mins $Therapeutic Activity: 8-22 mins PT General Charges $$ ACUTE PT VISIT: 1 Visit                     Hendricks Limes, PT 05/02/23, 1:17 PM

## 2023-05-02 NOTE — [place] FL2 (Signed)
[place] MEDICAID FL2 LEVEL OF CARE FORM     IDENTIFICATION  Patient Name: Brooke Pierce Birthdate: 17-Dec-1931 Sex: female Admission Date (Current Location): 04/28/2023  [facility] and [place] Number:  Chiropodist and Address:  [facility], [address] [place], [place] [address]      Provider Number: [id number]  Attending Physician Name and Address:  Marcelino Duster, MD  Relative Name and Phone Number:       Current Level of Care: Hospital Recommended Level of Care: Assisted Living Facility (with hospice) Prior Approval Number:    Date Approved/Denied:   PASRR Number:    Discharge Plan: Other (Comment) (ALF with hospice)    Current Diagnoses: Patient Active Problem List   Diagnosis Date Noted   Acute on chronic diastolic (congestive) heart failure (HCC) 04/28/2023   Overweight (BMI 25.0-29.9) 04/28/2023   Chronic kidney disease, stage 3a (HCC) 04/28/2023   Acute on chronic diastolic CHF (congestive heart failure) (HCC) 04/28/2023   Myocardial injury 04/28/2023   Atrial fibrillation with RVR (HCC) 03/31/2023   UTI (urinary tract infection) 03/31/2023   Chronic diastolic CHF (congestive heart failure) (HCC) 03/31/2023   HLD (hyperlipidemia) 03/31/2023   Depression with anxiety 03/31/2023   Acute metabolic encephalopathy 03/31/2023   Hypokalemia 03/27/2023   Lactic acidosis 03/27/2023   Gastroenteritis 03/26/2023   Paroxysmal A-fib (HCC) 03/26/2023   AKI (acute kidney injury) (HCC) 03/26/2023   Stroke (HCC) 09/14/2021   Hypertension 09/14/2021   Dementia (HCC) 09/14/2021    Orientation RESPIRATION BLADDER Height & Weight     Self  Normal Incontinent Weight: 169 lb 1.5 oz (76.7 kg) Height:  5\' 5"  (165.1 cm)  BEHAVIORAL SYMPTOMS/MOOD NEUROLOGICAL BOWEL NUTRITION STATUS   (None)   Incontinent Diet (Low sodium heart healthy)  AMBULATORY STATUS COMMUNICATION OF NEEDS Skin   Limited Assist Verbally Normal                        Personal Care Assistance Level of Assistance  Bathing, Feeding, Dressing Bathing Assistance: Limited assistance Feeding assistance: Limited assistance Dressing Assistance: Limited assistance     Functional Limitations Info  Sight, Hearing, Speech Sight Info: Adequate Hearing Info: Adequate Speech Info: Adequate    SPECIAL CARE FACTORS FREQUENCY                       Contractures Contractures Info: Not present    Additional Factors Info  Code Status, Allergies Code Status Info: DNR Allergies Info: Calcium-containing Compounds, Calcium-containing Compounds, Coreg (Carvedilol), Coreg (Carvedilol), Iodine           Current Medications (05/02/2023):  This is the current hospital active medication list Current Facility-Administered Medications  Medication Dose Route Frequency Provider Last Rate Last Admin   acetaminophen (TYLENOL) tablet 650 mg  650 mg Oral Q6H PRN Lorretta Harp, MD   650 mg at 04/30/23 1823   albuterol (PROVENTIL) (2.5 MG/3ML) 0.083% nebulizer solution 2.5 mg  2.5 mg Inhalation Q4H PRN Lorretta Harp, MD       alum & mag hydroxide-simeth (MAALOX/MYLANTA) 200-200-20 MG/5ML suspension 15 mL  15 mL Oral Q6H PRN Marcelino Duster, MD       apixaban (ELIQUIS) tablet 5 mg  5 mg Oral BID Marcelino Duster, MD   5 mg at 05/02/23 1004   atorvastatin (LIPITOR) tablet 80 mg  80 mg Oral Marice Potter, MD   80 mg at 05/01/23 2125   clopidogrel (PLAVIX) tablet 75  mg  75 mg Oral Daily Lorretta Harp, MD   75 mg at 05/02/23 1005   dextromethorphan-guaiFENesin (MUCINEX DM) 30-600 MG per 12 hr tablet 1 tablet  1 tablet Oral BID PRN Lorretta Harp, MD       diphenhydrAMINE (BENADRYL) injection 12.5 mg  12.5 mg Intravenous Q8H PRN Lorretta Harp, MD       dorzolamide-timolol (COSOPT) 2-0.5 % ophthalmic solution 1 drop  1 drop Both Eyes BID Lorretta Harp, MD   1 drop at 05/02/23 1008   DULoxetine (CYMBALTA) DR capsule 90 mg  90 mg Oral Daily Lorretta Harp, MD   90 mg at 05/02/23  1005   famotidine (PEPCID) tablet 20 mg  20 mg Oral q AM Lorretta Harp, MD   20 mg at 05/02/23 0731   feeding supplement (ENSURE ENLIVE / ENSURE PLUS) liquid 237 mL  237 mL Oral BID BM Sreenath, Sudheer B, MD   237 mL at 05/02/23 1006   furosemide (LASIX) tablet 40 mg  40 mg Oral Daily Marcelino Duster, MD   40 mg at 05/02/23 1005   hydrALAZINE (APRESOLINE) injection 5 mg  5 mg Intravenous Q2H PRN Lorretta Harp, MD       latanoprost (XALATAN) 0.005 % ophthalmic solution 1 drop  1 drop Both Eyes q1600 Lorretta Harp, MD   1 drop at 05/01/23 1806   loperamide (IMODIUM) capsule 4 mg  4 mg Oral QID PRN Lorretta Harp, MD       losartan (COZAAR) tablet 50 mg  50 mg Oral Daily Lorretta Harp, MD   50 mg at 05/02/23 1004   magnesium hydroxide (MILK OF MAGNESIA) suspension 30 mL  30 mL Oral Daily PRN Lorretta Harp, MD       metoprolol tartrate (LOPRESSOR) tablet 50 mg  50 mg Oral BID Lorretta Harp, MD   50 mg at 05/02/23 1004   mirtazapine (REMERON) tablet 15 mg  15 mg Oral QHS Lorretta Harp, MD   15 mg at 05/01/23 2126   nitrofurantoin (MACRODANTIN) capsule 50 mg  50 mg Oral q1600 Lorretta Harp, MD   50 mg at 05/01/23 1800   polyvinyl alcohol (LIQUIFILM TEARS) 1.4 % ophthalmic solution 1 drop  1 drop Both Eyes PRN Marcelino Duster, MD       QUEtiapine (SEROQUEL) tablet 25 mg  25 mg Oral Daily PRN Marcelino Duster, MD       QUEtiapine (SEROQUEL) tablet 50 mg  50 mg Oral QHS Lorretta Harp, MD   50 mg at 05/01/23 2125     Discharge Medications: TAKE these medications     acetaminophen 325 MG tablet Commonly known as: TYLENOL Take 650 mg by mouth every 4 (four) hours as needed for mild pain or fever.    aluminum-magnesium hydroxide-simethicone 200-200-20 MG/5ML Susp Commonly known as: MAALOX Take 30 mLs by mouth 4 (four) times daily as needed (heartburn or indigestion).    apixaban 5 MG Tabs tablet Commonly known as: ELIQUIS Take 5 mg by mouth every 12 (twelve) hours.    atorvastatin 80 MG tablet Commonly known  as: LIPITOR Take 80 mg by mouth at bedtime.    benzonatate 100 MG capsule Commonly known as: TESSALON Take 100 mg by mouth 3 (three) times daily as needed for cough.    clopidogrel 75 MG tablet Commonly known as: PLAVIX Take 75 mg by mouth daily.    D3 50 MCG (2000 UT) Tabs Generic drug: Cholecalciferol Take 1 tablet by mouth daily.    dorzolamide-timolol 2-0.5 % ophthalmic  solution Commonly known as: COSOPT Place 1 drop into both eyes 2 (two) times daily.    DULoxetine 30 MG capsule Commonly known as: CYMBALTA Take 30 mg by mouth daily. Take along with one 60 mg capsule for total 90 mg once daily    DULoxetine 60 MG capsule Commonly known as: CYMBALTA Take 60 mg by mouth daily. Take along with one 30 mg capsule for total 90 mg once daily    famotidine 20 MG tablet Commonly known as: PEPCID Take 20 mg by mouth in the morning.    furosemide 20 MG tablet Commonly known as: LASIX Take 2 tablets (40 mg total) by mouth daily. What changed: how much to take    guaiFENesin 100 MG/5ML liquid Commonly known as: ROBITUSSIN Take 15 mLs by mouth every 6 (six) hours as needed for cough or to loosen phlegm.    latanoprost 0.005 % ophthalmic solution Commonly known as: XALATAN Place 1 drop into both eyes daily at 4 PM.    loperamide 2 MG tablet Commonly known as: IMODIUM A-D Take 4 mg by mouth 4 (four) times daily as needed for diarrhea or loose stools.    losartan 50 MG tablet Commonly known as: COZAAR Take 50 mg by mouth daily.    magnesium hydroxide 400 MG/5ML suspension Commonly known as: MILK OF MAGNESIA Take 30 mLs by mouth daily as needed for mild constipation or moderate constipation.    metoprolol tartrate 50 MG tablet Commonly known as: LOPRESSOR Take 50 mg by mouth 2 (two) times daily.    mirtazapine 15 MG tablet Commonly known as: REMERON Take 15 mg by mouth at bedtime.    nitrofurantoin 50 MG capsule Commonly known as: MACRODANTIN Take 50 mg by mouth  daily at 4 PM.    ondansetron 4 MG tablet Commonly known as: ZOFRAN Take 4 mg by mouth every 8 (eight) hours as needed.    phenol 1.4 % Liqd Commonly known as: CHLORASEPTIC Use as directed 1 spray in the mouth or throat every 2 (two) hours as needed for throat irritation / pain.    potassium chloride 10 MEQ tablet Commonly known as: KLOR-CON M Take 1 tablet (10 mEq total) by mouth 2 (two) times daily for 3 days.    QUEtiapine 25 MG tablet Commonly known as: SEROQUEL Take 25 mg by mouth daily as needed (agitation).    QUEtiapine 50 MG tablet Commonly known as: SEROQUEL Take 50 mg by mouth daily at 4 PM.    Refresh Plus 0.5 % Soln Generic drug: Carboxymethylcellulose Sod PF Place 1 drop into both eyes 4 (four) times daily.    Refresh Tears 0.5 % Soln Generic drug: carboxymethylcellulose Place 1 drop into both eyes in the morning, at noon, in the evening, and at bedtime.    senna 8.6 MG tablet Commonly known as: SENOKOT Take 1 tablet by mouth 2 (two) times daily. *HOLD FOR LOOSE STOOLS*    Relevant Imaging Results:  Relevant Lab Results:   Additional Information SS#: [id number]  Margarito Liner, LCSW

## 2023-05-02 NOTE — Plan of Care (Signed)

## 2023-05-02 NOTE — TOC Transition Note (Signed)
Transition of Care [facility]) - Discharge Note   Patient Details  Name: Brooke Pierce MRN: [id number] Date of Birth: 1931/06/05  Transition of Care [facility]) CM/SW Contact:  Margarito Liner, LCSW Phone Number: 05/02/2023, 12:50 PM   Clinical Narrative:   Patient has orders to discharge back to [facility] with hospice today. [facility] hospice liaison is aware. RN will call report to [phone]. CSW faxed FL2 and discharge summary to facility. No DME needs. No further concerns. CSW signing off.  Final next level of care: Assisted Living (with hospice) Barriers to Discharge: Barriers Resolved   Patient Goals and CMS Choice     Choice offered to / list presented to : [facility] POA / Guardian      Discharge Placement                Patient to be transferred to facility by: Frankey Shown Name of family member notified: Frankey Shown Patient and family notified of of transfer: 05/02/23  Discharge Plan and Services Additional resources added to the After Visit Summary for       Post Acute Care Choice: Resumption of Svcs/PTA Provider                    HH Arranged: RN, PT, OT [facility] Agency: [facility] Date [facility] Agency Contacted: 05/01/23   Representative spoke with at [facility] Agency: Velna Hatchet  Social Drivers of Health (SDOH) Interventions SDOH Screenings   Food Insecurity: No Food Insecurity (04/29/2023)  Housing: Low Risk  (04/29/2023)  Transportation Needs: No Transportation Needs (04/29/2023)  Utilities: Not At Risk (04/29/2023)  Financial Resource Strain: Low Risk  (08/14/2022)   Received from [facility]  Social Connections: Moderately Integrated (04/29/2023)  Tobacco Use: Low Risk  (04/29/2023)     Readmission Risk Interventions    05/01/2023    3:18 PM  Readmission Risk Prevention Plan  Transportation Screening Complete  Medication Review (RN Care Manager) Complete  PCP or Specialist appointment within 3-5 days of discharge Complete  HRI  or Home Care Consult Complete  SW Recovery Care/Counseling Consult Complete  Palliative Care Screening Complete  Skilled Nursing Facility Not Applicable

## 2023-05-02 NOTE — Consult Note (Signed)
Consultation Note Date: 05/02/2023   Patient Name: Brooke Pierce  DOB: [birth year]  MRN: [id number]  Age / Sex: 89 y.o., female  PCP: Bryson Corona, NP Referring Physician: Marcelino Duster, MD  Reason for Consultation: Establishing goals of care   HPI/Brief Hospital Course: 89 y.o. female  with past medical history of HTN, HLD, CHF, CVA, dementia, A. Fib on Eliquis, RLS, HOH, carotid artery stenosis and CKD stage 3a admitted from [facility] on 04/28/2023 with weakness and shortness of breath. Admitted and being treated for CHF exacerbation.   Palliative medicine was consulted for assisting with goals of care conversations.  Subjective:  Extensive chart review has been completed prior to meeting patient including labs, vital signs, imaging, progress notes, orders, and available advanced directive documents from current and previous encounters.  Visited with Ms. Meroney at her bedside. She is resting in bed, does not acknowledge my presence in room. Daughter in law, Drinda Butts at bedside during time of visit.  Introduced myself as a Publishing rights manager as a member of the palliative care team. Explained palliative medicine is specialized medical care for people living with serious illness. It focuses on providing relief from the symptoms and stress of a serious illness. The goal is to improve quality of life for both the patient and the family.   Drinda Butts shares a brief life review. Ms. Mucha is not currently married, her children are deceased. Drinda Butts was married to Ms. Grunewald's son. 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 She babysat for her friend Herbert Seta who remains active in Ms. Younker's care taking. Drinda Butts and [employer] share responsibilities. Drinda Butts is local and Herbert Seta is out of state. Review of [place]yn[place], Drinda Butts listed as HCPOA #1 and Heather as #2.  At baseline, Drinda Butts shares that Ms. Blossom is able to transfer and ambulate with minimal  assistance and requires assistance with ADL's. Baseline mentation is impaired, Drinda Butts shares she is not able to hold a conversation, able to recognize familiar faces but unable to recall names. Drinda Butts shares Ms. Plush's appetite fluctuates. Drinda Butts shares Ms. Aller does not typically complain of pain and is able to sleep well through the night.  We discussed patient's current illness and what it means in the larger context of patient's on-going co-morbidities. Natural disease trajectory and expectations at EOL were discussed.   The difference between aggressive medical intervention and comfort care was discussed.  We discussed the difference between Palliative care and Hospice services. We discussed the overall philosophy of hospice, services offered and eligibility criteria.  Drinda Butts shares she is familiar with hospice services as she has been involved with caring for other family members involved with hospice. Drinda Butts feels transitioning to hospice with return to ALF is the best option at this time. Annette requests time to discuss with Herbert Seta, asks that I return to bedside later in AM.  Returned to bedside, Drinda Butts unable to communicate with Herbert Seta but feels certain she will be on board with plan of care. After conversations with primary team she has decided to pursue hospice. Primary team and TOC engaged. Likely to discharge back to ALF today.  I discussed importance of continued conversations with family/support persons and all members of their medical team regarding overall plan of care and treatment options ensuring decisions are in alignment with patients goals of care.  All questions/concerns addressed. PMT will continue to follow and support patient as needed.  Objective: Primary Diagnoses: Present on Admission:  Acute on chronic diastolic (congestive) heart failure (  HCC)  Hypertension  Stroke (HCC)  HLD (hyperlipidemia)  Depression with anxiety  Paroxysmal A-fib (HCC)   Hypokalemia  Overweight (BMI 25.0-29.9)  Chronic kidney disease, stage 3a (HCC)  Myocardial injury   Physical Exam Constitutional:      General: She is not in acute distress.    Appearance: She is ill-appearing.  Pulmonary:     Effort: Pulmonary effort is normal. No respiratory distress.  Skin:    General: Skin is warm and dry.     Findings: Bruising present.  Neurological:     Mental Status: She is disoriented.     Motor: Weakness present.     Vital Signs: BP 123/72   Pulse 73   Temp 97.9 F (36.6 C)   Resp 16   Ht 5\' 5"  (1.651 m)   Wt 76.7 kg   SpO2 99%   BMI 28.14 kg/m  Pain Scale: 0-10   Pain Score: 0-No pain  IO: Intake/output summary:  Intake/Output Summary (Last 24 hours) at 05/02/2023 1512 Last data filed at 05/02/2023 1100 Gross per 24 hour  Intake 480 ml  Output --  Net 480 ml    LBM: Last BM Date : 05/01/23 Baseline Weight: Weight: 77.1 kg Most recent weight: Weight: 76.7 kg      Assessment and Plan  SUMMARY OF RECOMMENDATIONS   D/C back to ALF with hospice services following PMT to continue to follow for ongoing needs and support  Palliative Prophylaxis:   Bowel Regimen, Delirium Protocol and Frequent Pain Assessment  Discussed With:Primary team   Thank you for this consult and allowing Palliative Medicine to participate in the care of Yara S. Bellanca. Palliative medicine will continue to follow and assist as needed.   Time Total: 75 minutes  Time spent includes: Detailed review of medical records (labs, imaging, vital signs), medically appropriate exam (mental status, respiratory, cardiac, skin), discussed with treatment team, counseling and educating patient, family and staff, documenting clinical information, medication management and coordination of care.   Signed by: Leeanne Deed, DNP, AGNP-C Palliative Medicine    Please contact Palliative Medicine Team phone at [phone] for questions and concerns.  For individual  provider: See Loretha Stapler

## 2023-05-02 NOTE — TOC Progression Note (Addendum)
Transition of Care [facility]) - Progression Note    Patient Details  Name: Brooke Pierce MRN: [id number] Date of Birth: [birth year]  Transition of Care [facility]) CM/SW Contact  Margarito Liner, LCSW Phone Number: 05/02/2023, 11:30 AM  Clinical Narrative:   Per palliative, family has decided to pursue hospice when patient returns to [facility]. Administrator is aware and said that their preferred agency is [facility]. Drinda Butts is agreeable to this. CSW left voicemail for liaison. No DME needs at this time unless PT recommends anything. CSW sent secure chat to PT see if they can work with her today.   11:37 am: [facility] liaison has spoken with family.  Expected Discharge Plan: Assisted Living (with home health) Barriers to Discharge: Continued Medical Work up  Expected Discharge Plan and Services     Post Acute Care Choice: Resumption of Svcs/PTA Provider Living arrangements for the past 2 months: Assisted Living Facility Expected Discharge Date: 05/02/23                         HH Arranged: RN, PT, OT [facility] Agency: [facility] Date [facility] Agency Contacted: 05/01/23   Representative spoke with at [facility] Agency: Velna Hatchet   Social Determinants of Health (SDOH) Interventions SDOH Screenings   Food Insecurity: No Food Insecurity (04/29/2023)  Housing: Low Risk  (04/29/2023)  Transportation Needs: No Transportation Needs (04/29/2023)  Utilities: Not At Risk (04/29/2023)  Financial Resource Strain: Low Risk  (08/14/2022)   Received from [facility]  Social Connections: Moderately Integrated (04/29/2023)  Tobacco Use: Low Risk  (04/29/2023)    Readmission Risk Interventions    05/01/2023    3:18 PM  Readmission Risk Prevention Plan  Transportation Screening Complete  Medication Review (RN Care Manager) Complete  PCP or Specialist appointment within 3-5 days of discharge Complete  HRI or Home Care Consult Complete  SW Recovery Care/Counseling Consult  Complete  Palliative Care Screening Complete  Skilled Nursing Facility Not Applicable

## 2023-05-02 NOTE — Discharge Summary (Signed)
Physician Discharge Summary   Patient: Brooke Pierce MRN: [id number] DOB: [birth year]  Admit date:     04/28/2023  Discharge date: 05/02/23  Discharge Physician: Marcelino Duster   PCP: Bryson Corona, NP   Recommendations at discharge:    PCP follow up in 1 week. Hospice services follow up at the facility.  Discharge Diagnoses: Principal Problem:   Acute on chronic diastolic (congestive) heart failure (HCC) Active Problems:   Myocardial injury   Paroxysmal A-fib (HCC)   Hypertension   Stroke (HCC)   Hypokalemia   HLD (hyperlipidemia)   Chronic kidney disease, stage 3a (HCC)   Depression with anxiety   Overweight (BMI 25.0-29.9)  Resolved Problems:   * No resolved hospital problems. [facility] Course: Brooke Pierce is a 89 y.o. female with medical history significant of HTN, HDL, dCHF, stroke, depression with anxiety, dementia, A-fib on Eliquis, RLS, hard of hearing, carotid artery stenosis, CKD-3A, who presents with weakness and SOB.  Patient is admitted to the hospitalist service for evaluation of diastolic CHF exacerbation.   Assessment and Plan: Acute on chronic diastolic CHF- Lasix changed to oral 40mg  daily, 2D echocardiogram EF 50-55%, elevated PA pressures Daily weights, strict input and output. Discharge to ALF with hospice services.   Mildly elevated troponin- In the setting of CHF.  No chest pain. Continue home medications Plavix, Lipitor, Eliquis.   Paroxysmal atrial fibrillation- Heart rate well-controlled. Continue metoprolol, Eliquis therapy.   Hypertension- Continue metoprolol, Cozaar therapy. Caution with borderline low BP.   Hypokalemia- Recheck and replete as needed.   Hyperlipidemia History of stroke Continue Lipitor, Plavix.   CKD stage IIIa- Stable renal function. Continue to monitor as outpatient.   Depression and anxiety Dementia- Continue home dose Seroquel, Remeron therapy. Continue supportive  care.  Palliative team discussed with daughter in law who is POA. She want to have hospice services at the facility.       Consultants: Palliative Procedures performed: none  Disposition: Assisted living with hospice Diet recommendation:  Discharge Diet Orders (From admission, onward)     Start     Ordered   05/02/23 0000  Diet - low sodium heart healthy        05/02/23 1107           Cardiac diet DISCHARGE MEDICATION: Allergies as of 05/02/2023       Reactions   Calcium-containing Compounds Nausea And Vomiting   Calcium-containing Compounds    Coreg [carvedilol] Itching   Coreg [carvedilol]    Iodine         Medication List     TAKE these medications    acetaminophen 325 MG tablet Commonly known as: TYLENOL Take 650 mg by mouth every 4 (four) hours as needed for mild pain or fever.   aluminum-magnesium hydroxide-simethicone 200-200-20 MG/5ML Susp Commonly known as: MAALOX Take 30 mLs by mouth 4 (four) times daily as needed (heartburn or indigestion).   apixaban 5 MG Tabs tablet Commonly known as: ELIQUIS Take 5 mg by mouth every 12 (twelve) hours.   atorvastatin 80 MG tablet Commonly known as: LIPITOR Take 80 mg by mouth at bedtime.   benzonatate 100 MG capsule Commonly known as: TESSALON Take 100 mg by mouth 3 (three) times daily as needed for cough.   clopidogrel 75 MG tablet Commonly known as: PLAVIX Take 75 mg by mouth daily.   D3 50 MCG (2000 UT) Tabs Generic drug: Cholecalciferol Take 1 tablet by mouth daily.   dorzolamide-timolol 2-0.5 %  ophthalmic solution Commonly known as: COSOPT Place 1 drop into both eyes 2 (two) times daily.   DULoxetine 30 MG capsule Commonly known as: CYMBALTA Take 30 mg by mouth daily. Take along with one 60 mg capsule for total 90 mg once daily   DULoxetine 60 MG capsule Commonly known as: CYMBALTA Take 60 mg by mouth daily. Take along with one 30 mg capsule for total 90 mg once daily   famotidine 20  MG tablet Commonly known as: PEPCID Take 20 mg by mouth in the morning.   furosemide 20 MG tablet Commonly known as: LASIX Take 2 tablets (40 mg total) by mouth daily. What changed: how much to take   guaiFENesin 100 MG/5ML liquid Commonly known as: ROBITUSSIN Take 15 mLs by mouth every 6 (six) hours as needed for cough or to loosen phlegm.   latanoprost 0.005 % ophthalmic solution Commonly known as: XALATAN Place 1 drop into both eyes daily at 4 PM.   loperamide 2 MG tablet Commonly known as: IMODIUM A-D Take 4 mg by mouth 4 (four) times daily as needed for diarrhea or loose stools.   losartan 50 MG tablet Commonly known as: COZAAR Take 50 mg by mouth daily.   magnesium hydroxide 400 MG/5ML suspension Commonly known as: MILK OF MAGNESIA Take 30 mLs by mouth daily as needed for mild constipation or moderate constipation.   metoprolol tartrate 50 MG tablet Commonly known as: LOPRESSOR Take 50 mg by mouth 2 (two) times daily.   mirtazapine 15 MG tablet Commonly known as: REMERON Take 15 mg by mouth at bedtime.   nitrofurantoin 50 MG capsule Commonly known as: MACRODANTIN Take 50 mg by mouth daily at 4 PM.   ondansetron 4 MG tablet Commonly known as: ZOFRAN Take 4 mg by mouth every 8 (eight) hours as needed.   phenol 1.4 % Liqd Commonly known as: CHLORASEPTIC Use as directed 1 spray in the mouth or throat every 2 (two) hours as needed for throat irritation / pain.   potassium chloride 10 MEQ tablet Commonly known as: KLOR-CON M Take 1 tablet (10 mEq total) by mouth 2 (two) times daily for 3 days.   QUEtiapine 25 MG tablet Commonly known as: SEROQUEL Take 25 mg by mouth daily as needed (agitation).   QUEtiapine 50 MG tablet Commonly known as: SEROQUEL Take 50 mg by mouth daily at 4 PM.   Refresh Plus 0.5 % Soln Generic drug: Carboxymethylcellulose Sod PF Place 1 drop into both eyes 4 (four) times daily.   Refresh Tears 0.5 % Soln Generic drug:  carboxymethylcellulose Place 1 drop into both eyes in the morning, at noon, in the evening, and at bedtime.   senna 8.6 MG tablet Commonly known as: SENOKOT Take 1 tablet by mouth 2 (two) times daily. *HOLD FOR LOOSE STOOLS*               Discharge Care Instructions  (From admission, onward)           Start     Ordered   05/02/23 0000  Discharge wound care:       Comments: Tuck piece of cotton or 2X2 gauze between 4th and 5th toe spaces Q day   05/02/23 1107            Discharge Exam: Filed Weights   04/30/23 0500 05/01/23 0500 05/02/23 0500  Weight: 79 kg 74.5 kg 76.7 kg   General - Elderly ill Caucasian female, no apparent distress HEENT - PERRLA, EOMI, atraumatic  head, hard of hearing Lung - Clear, diffuse rales, rhonchi, no wheezes. Heart - S1, S2 heard, no murmurs, rubs, trace pitting pedal edema. Abdomen - Soft, non tender non distended, bowel sounds good Neuro - Alert, awake pleasantly confused, non focal exam. Skin - Warm and dry.  Condition at discharge: stable  The results of significant diagnostics from this hospitalization (including imaging, microbiology, ancillary and laboratory) are listed below for reference.   Imaging Studies: ECHOCARDIOGRAM COMPLETE Result Date: 04/30/2023    ECHOCARDIOGRAM REPORT   Patient Name:   ZAREAH HUNZEKER Date of Exam: 04/29/2023 Medical Rec #:  [id number]        Height:       65.0 in Accession #:    [id number]       Weight:       170.0 lb Date of Birth:  [birth year]        BSA:          1.846 m Patient Age:    89 years         BP:           143/59 mmHg Patient Gender: F                HR:           61 bpm. Exam Location:  [facility] Procedure: 2D Echo, Cardiac Doppler and Color Doppler Indications:     CHF  History:         Patient has prior history of Echocardiogram examinations, most                  recent 09/14/2021. CHF, Stroke, Arrythmias:Atrial Fibrillation,                  Signs/Symptoms:Altered Mental Status; Risk  Factors:Hypertension                  and Dyslipidemia. CKD.  Sonographer:     Mikki Harbor Referring Phys:  [id number] Brien Few NIU Diagnosing Phys: Marcina Millard MD  Sonographer Comments: Suboptimal subcostal window. IMPRESSIONS  1. Left ventricular ejection fraction, by estimation, is 50 to 55%. The left ventricle has low normal function. The left ventricle has no regional wall motion abnormalities. Indeterminate diastolic filling due to E-A fusion.  2. Right ventricular systolic function is normal. The right ventricular size is normal. There is severely elevated pulmonary artery systolic pressure.  3. Left atrial size was moderately dilated.  4. The mitral valve is normal in structure. Moderate mitral valve regurgitation. No evidence of mitral stenosis.  5. Tricuspid valve regurgitation is mild to moderate.  6. The aortic valve is normal in structure. Aortic valve regurgitation is trivial. No aortic stenosis is present.  7. The inferior vena cava is normal in size with greater than 50% respiratory variability, suggesting right atrial pressure of 3 mmHg. FINDINGS  Left Ventricle: Left ventricular ejection fraction, by estimation, is 50 to 55%. The left ventricle has low normal function. The left ventricle has no regional wall motion abnormalities. The left ventricular internal cavity size was normal in size. There is no left ventricular hypertrophy. Indeterminate diastolic filling due to E-A fusion. Right Ventricle: The right ventricular size is normal. No increase in right ventricular wall thickness. Right ventricular systolic function is normal. There is severely elevated pulmonary artery systolic pressure. The tricuspid regurgitant velocity is 3.54 m/s, and with an assumed right atrial pressure of 15 mmHg, the estimated right ventricular systolic pressure is 65.1 mmHg. Left Atrium: Left atrial size  was moderately dilated. Right Atrium: Right atrial size was normal in size. Pericardium: There is no evidence of  pericardial effusion. Mitral Valve: The mitral valve is normal in structure. Moderate mitral valve regurgitation. No evidence of mitral valve stenosis. MV peak gradient, 5.4 mmHg. The mean mitral valve gradient is 2.0 mmHg. Tricuspid Valve: The tricuspid valve is normal in structure. Tricuspid valve regurgitation is mild to moderate. No evidence of tricuspid stenosis. Aortic Valve: The aortic valve is normal in structure. Aortic valve regurgitation is trivial. No aortic stenosis is present. Aortic valve mean gradient measures 2.5 mmHg. Aortic valve peak gradient measures 4.8 mmHg. Aortic valve area, by VTI measures 1.82 cm. Pulmonic Valve: The pulmonic valve was normal in structure. Pulmonic valve regurgitation is not visualized. No evidence of pulmonic stenosis. Aorta: The aortic root is normal in size and structure. Venous: The inferior vena cava is normal in size with greater than 50% respiratory variability, suggesting right atrial pressure of 3 mmHg. IAS/Shunts: No atrial level shunt detected by color flow Doppler.  LEFT VENTRICLE PLAX 2D LVIDd:         4.70 cm LVIDs:         3.50 cm LV PW:         1.10 cm LV IVS:        1.40 cm LVOT diam:     2.00 cm LV SV:         45 LV SV Index:   24 LVOT Area:     3.14 cm  RIGHT VENTRICLE RV Basal diam:  3.70 cm RV Mid diam:    3.10 cm RV S prime:     8.16 cm/s TAPSE (M-mode): 2.0 cm LEFT ATRIUM             Index        RIGHT ATRIUM           Index LA diam:        4.50 cm 2.44 cm/m   RA Area:     21.30 cm LA Vol (A2C):   73.2 ml 39.65 ml/m  RA Volume:   66.80 ml  36.18 ml/m LA Vol (A4C):   70.1 ml 37.97 ml/m LA Biplane Vol: 77.2 ml 41.82 ml/m  AORTIC VALVE                    PULMONIC VALVE AV Area (Vmax):    1.92 cm     PV Vmax:       0.68 m/s AV Area (Vmean):   1.78 cm     PV Peak grad:  1.9 mmHg AV Area (VTI):     1.82 cm AV Vmax:           110.00 cm/s AV Vmean:          72.350 cm/s AV VTI:            0.244 m AV Peak Grad:      4.8 mmHg AV Mean Grad:      2.5  mmHg LVOT Vmax:         67.20 cm/s LVOT Vmean:        41.000 cm/s LVOT VTI:          0.142 m LVOT/AV VTI ratio: 0.58  AORTA Ao Root diam: 3.10 cm MITRAL VALVE                  TRICUSPID VALVE MV Area (PHT): 3.06 cm       TR Peak grad:  50.1 mmHg MV Area VTI:   1.29 cm       TR Vmax:        354.00 cm/s MV Peak grad:  5.4 mmHg MV Mean grad:  2.0 mmHg       SHUNTS MV Vmax:       1.16 m/s       Systemic VTI:  0.14 m MV Vmean:      56.2 cm/s      Systemic Diam: 2.00 cm MV Decel Time: 248 msec MR Peak grad:    115.3 mmHg MR Mean grad:    76.0 mmHg MR Vmax:         537.00 cm/s MR Vmean:        414.0 cm/s MR PISA:         1.01 cm MR PISA Eff ROA: 17 mm MR PISA Radius:  0.40 cm MV E velocity: 96.40 cm/s Marcina Millard MD Electronically signed by Marcina Millard MD Signature Date/Time: 04/30/2023/1:22:07 PM    Final    DG Chest Port 1 View Result Date: 04/28/2023 CLINICAL DATA:  Shortness of breath EXAM: PORTABLE CHEST 1 VIEW COMPARISON:  03/31/2023 FINDINGS: Stable cardiomegaly. Aortic atherosclerotic calcification. Pulmonary vascular congestion. Question infiltrates in the left costophrenic angle versus soft tissue overlap. No pleural effusion or pneumothorax. IMPRESSION: 1. Cardiomegaly with pulmonary vascular congestion. 2. Question infiltrates in the left costophrenic angle versus soft tissue overlap. Electronically Signed   By: Minerva Fester M.D.   On: 04/28/2023 21:38    Microbiology: Results for orders placed or performed during the hospital encounter of 04/28/23  Resp panel by RT-PCR (RSV, Flu A&B, Covid) Anterior Nasal Swab     Status: None   Collection Time: 04/28/23  9:00 PM   Specimen: Anterior Nasal Swab  Result Value Ref Range Status   SARS Coronavirus 2 by RT PCR NEGATIVE NEGATIVE Final    Comment: (NOTE) SARS-CoV-2 target nucleic acids are NOT DETECTED.  The SARS-CoV-2 RNA is generally detectable in upper respiratory specimens during the acute phase of infection. The  lowest concentration of SARS-CoV-2 viral copies this assay can detect is 138 copies/mL. A negative result does not preclude SARS-Cov-2 infection and should not be used as the sole basis for treatment or other patient management decisions. A negative result may occur with  improper specimen collection/handling, submission of specimen other than nasopharyngeal swab, presence of viral mutation(s) within the areas targeted by this assay, and inadequate number of viral copies(<138 copies/mL). A negative result must be combined with clinical observations, patient history, and epidemiological information. The expected result is Negative.  Fact Sheet for Patients:  BloggerCourse.com  Fact Sheet for Healthcare Providers:  SeriousBroker.it  This test is no t yet approved or cleared by the [place] FDA and  has been authorized for detection and/or diagnosis of SARS-CoV-2 by FDA under an Emergency Use Authorization (EUA). This EUA will remain  in effect (meaning this test can be used) for the duration of the COVID-19 declaration under Section 564(b)(1) of the Act, 21 U.S.C.section 360bbb-3(b)(1), unless the authorization is terminated  or revoked sooner.       Influenza A by PCR NEGATIVE NEGATIVE Final   Influenza B by PCR NEGATIVE NEGATIVE Final    Comment: (NOTE) The Xpert Xpress SARS-CoV-2/FLU/RSV plus assay is intended as an aid in the diagnosis of influenza from Nasopharyngeal swab specimens and should not be used as a sole basis for treatment. Nasal washings and aspirates are unacceptable for Xpert Xpress SARS-CoV-2/FLU/RSV testing.  Fact Sheet for Patients: BloggerCourse.com  Fact Sheet for Healthcare Providers: SeriousBroker.it  This test is not yet approved or cleared by the [place] FDA and has been authorized for detection and/or diagnosis of SARS-CoV-2 by FDA under  an Emergency Use Authorization (EUA). This EUA will remain in effect (meaning this test can be used) for the duration of the COVID-19 declaration under Section 564(b)(1) of the Act, 21 U.S.C. section 360bbb-3(b)(1), unless the authorization is terminated or revoked.     Resp Syncytial Virus by PCR NEGATIVE NEGATIVE Final    Comment: (NOTE) Fact Sheet for Patients: BloggerCourse.com  Fact Sheet for Healthcare Providers: SeriousBroker.it  This test is not yet approved or cleared by the [place] FDA and has been authorized for detection and/or diagnosis of SARS-CoV-2 by FDA under an Emergency Use Authorization (EUA). This EUA will remain in effect (meaning this test can be used) for the duration of the COVID-19 declaration under Section 564(b)(1) of the Act, 21 U.S.C. section 360bbb-3(b)(1), unless the authorization is terminated or revoked.  Performed at [facility], [address] [place], [place] [address]     Labs: CBC: Recent Labs  Lab 04/28/23 2100  WBC 8.9  HGB 12.2  HCT 37.4  MCV 84.8  PLT 243   Basic Metabolic Panel: Recent Labs  Lab 04/28/23 2100 04/29/23 0642 05/01/23 1408 05/02/23 0744  NA 137 139 142 142  K 3.2* 3.7 3.4* 3.3*  CL 103 106 101 102  CO2 23 24 29 28   GLUCOSE 111* 131* 148* 114*  BUN 25* 25* 27* 27*  CREATININE 1.17* 1.19* 1.31* 1.20*  CALCIUM 9.3 8.7* 9.0 8.9  MG 2.1  --   --   --   PHOS 3.3  --   --   --    Liver Function Tests: Recent Labs  Lab 04/28/23 2100  AST 17  ALT 18  ALKPHOS 49  BILITOT 1.3*  PROT 6.8  ALBUMIN 3.6   CBG: No results for input(s): "GLUCAP" in the last 168 hours.  Discharge time spent: 35 minutes.  Signed: Marcelino Duster, MD [facility] 05/02/2023

## 2024-03-07 IMAGING — CR DG LUMBAR SPINE COMPLETE 4+V
1 series · 5 of 5 positions shown · non-contrast
Comparison: None Available.

CLINICAL DATA: Pt here for chronic back pain, states it has been
getting worse. Pt has a hx of dementia. Denies any recent falls.
States the pain is worse and she been having trouble walking. Denies
urinary symptoms.

EXAM:
LUMBAR SPINE - COMPLETE 4+ VIEW

[Series 1: t lumbar spine ap · 0.14mm/px · 5 of 5 slices shown]
[im 1/5]
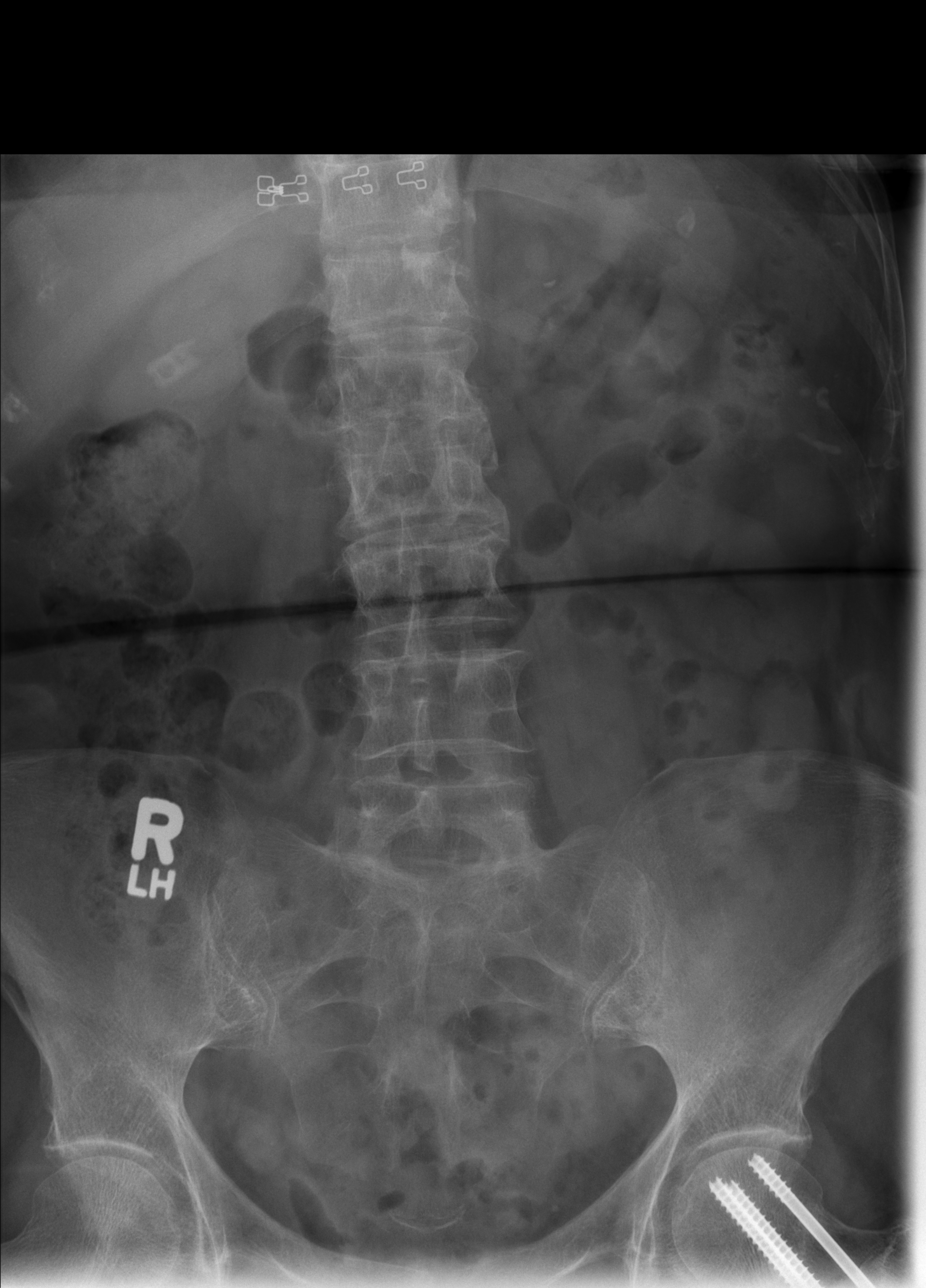
[im 2/5]
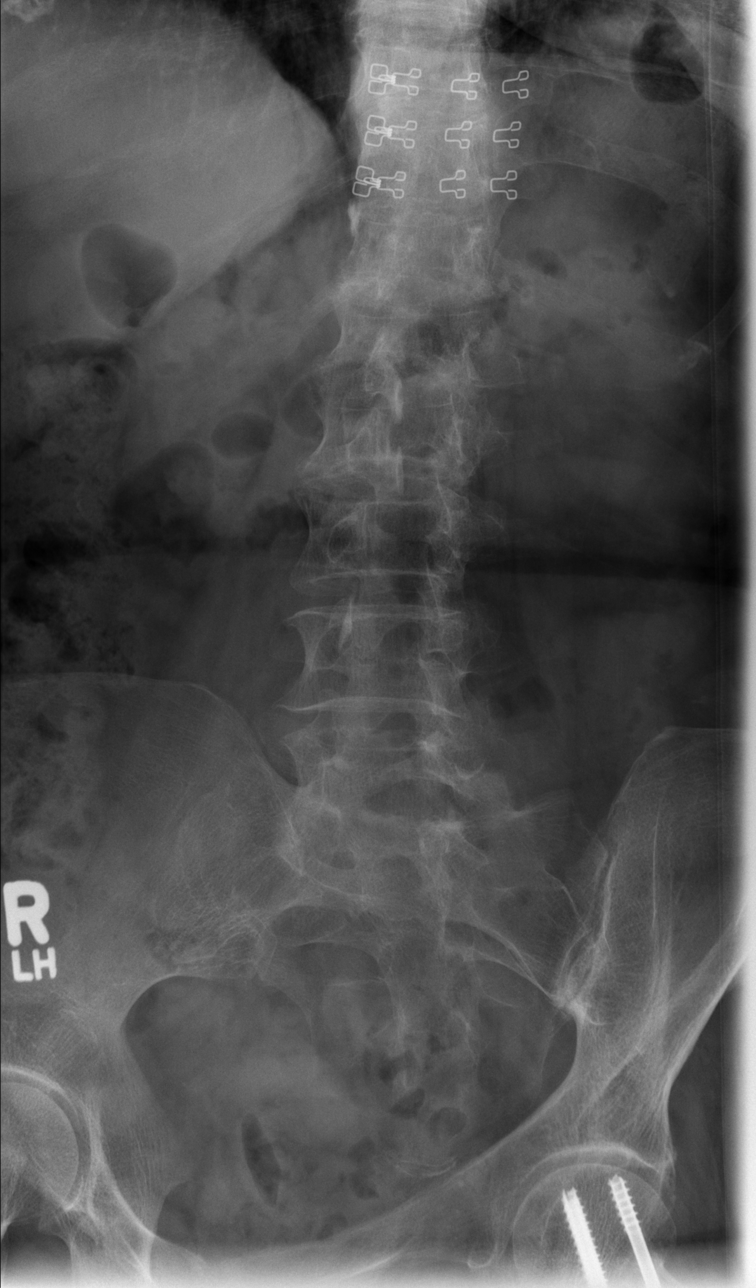
[im 3/5]
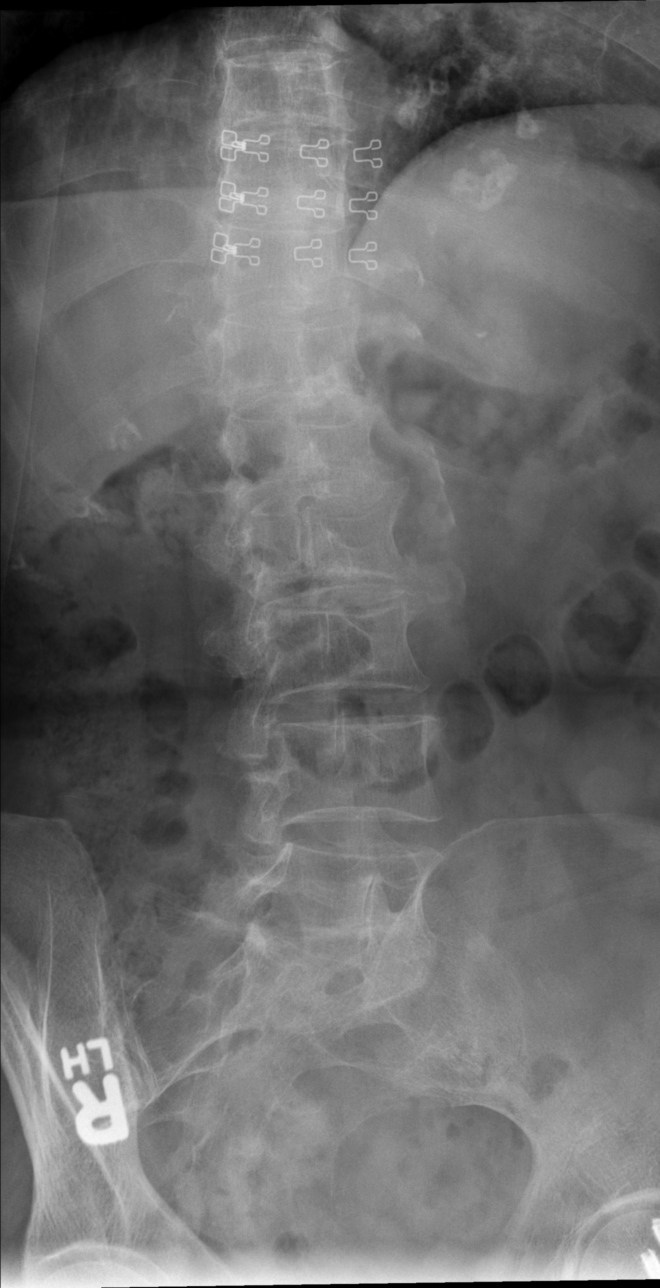
[im 4/5]
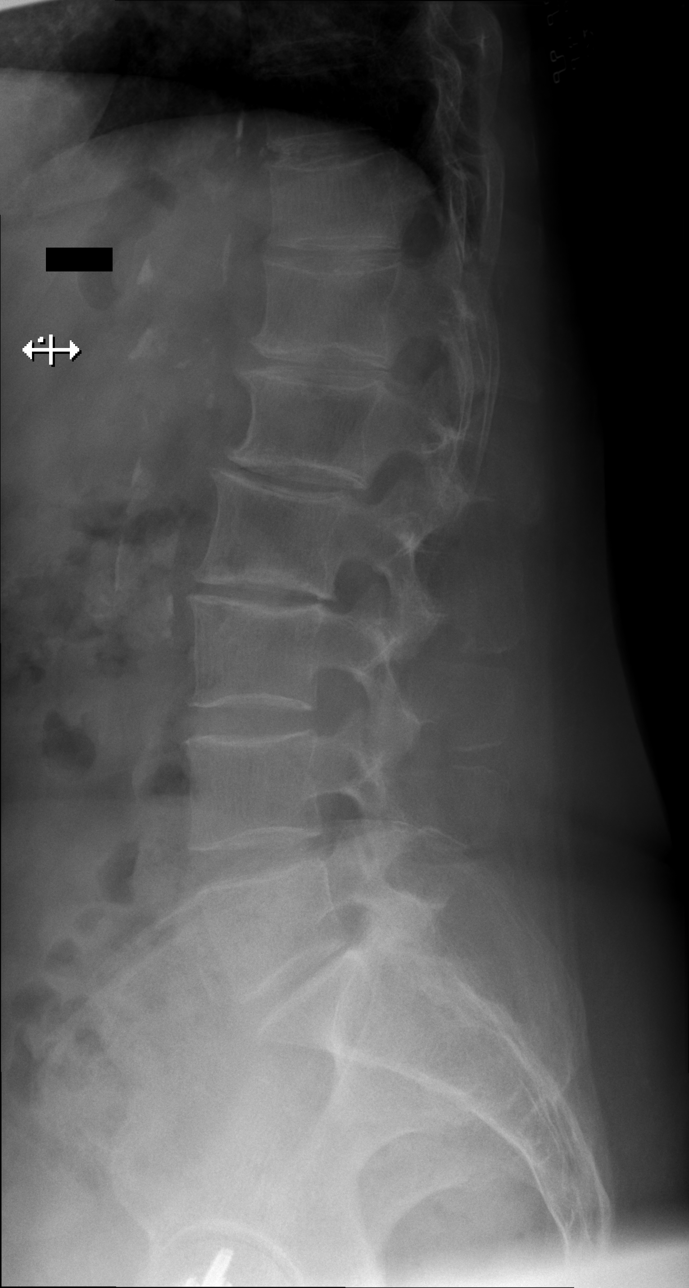
[im 5/5]
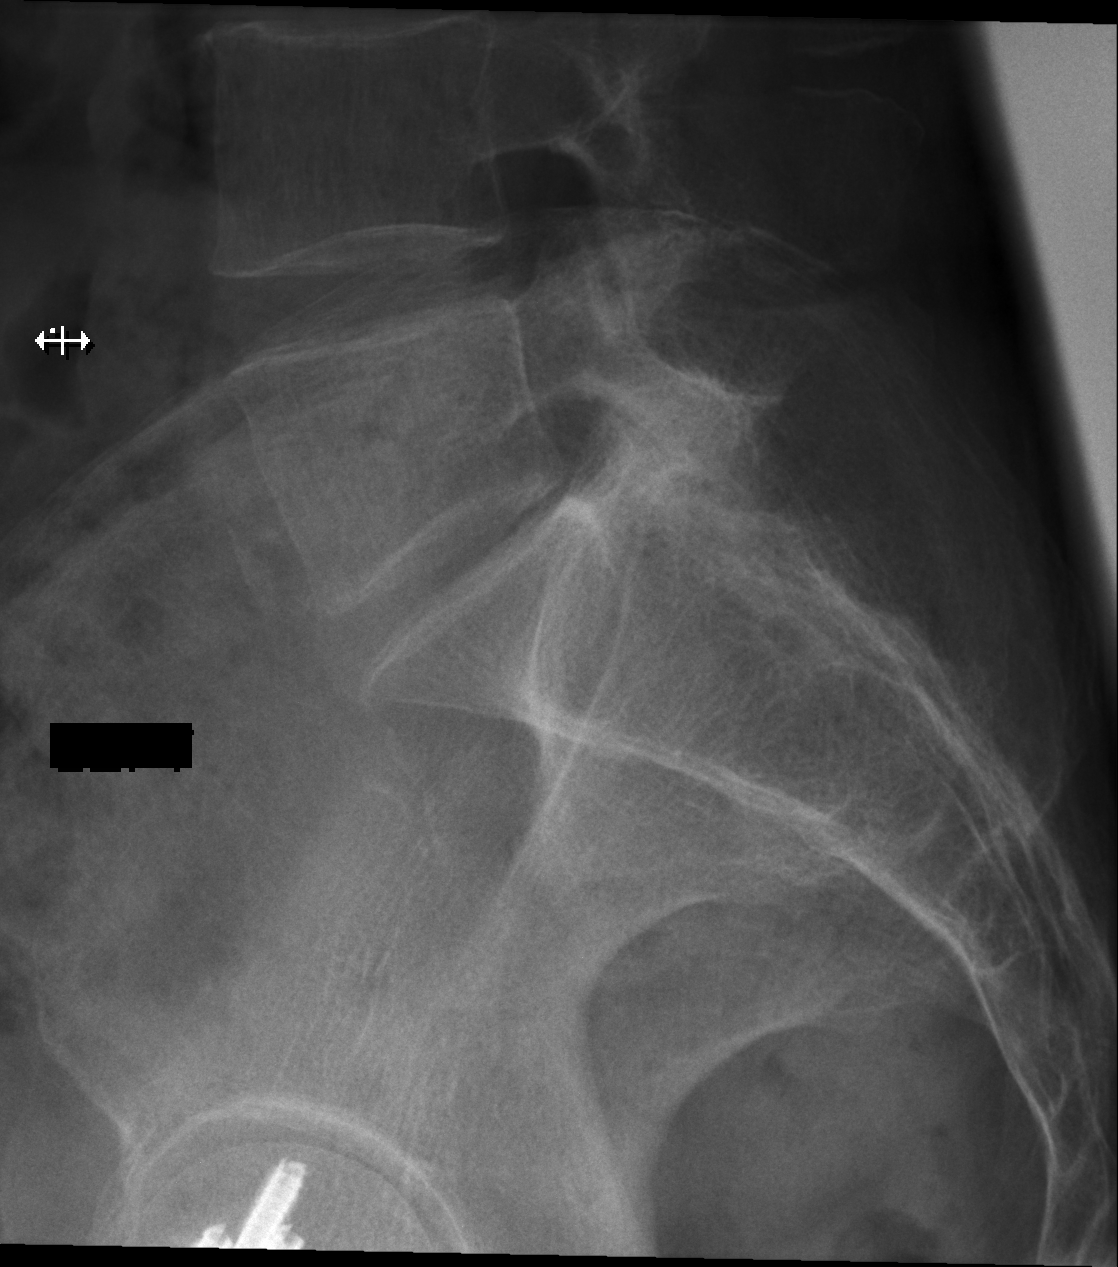

[5 of 5 positions shown; findings below may reference images not displayed]

FINDINGS: No fracture.  No bone lesion.

Grade 1 retrolisthesis of L1 and L2.  No other spondylolisthesis.

Moderate loss of disc height at L1-L2 and L2-L3. Mild loss of disc
height at L5-S1. Facet joint narrowing on the left at L4-L5 and
L5-S1 and on the right at L3-L4, L4-L5 and L5-S1.

Scattered aortic atherosclerotic calcifications. Soft tissues
otherwise unremarkable.

Three screws proximal left femur, incompletely imaged, from a prior
left proximal femur fracture ORIF.
IMPRESSION: 1. No fracture or acute finding.
2. Disc and facet degenerative changes as detailed.

## 2024-03-12 IMAGING — MR MR MRA HEAD W/O CM
1 series · 25 of 48 positions shown · non-contrast
Comparison: Prior CT from earlier the same day.

CLINICAL DATA: Initial evaluation for neuro deficit, stroke
suspected.

EXAM:
MRI HEAD WITHOUT CONTRAST
MRA HEAD WITHOUT CONTRAST
TECHNIQUE: Multiplanar, multi-echo pulse sequences of the brain and surrounding
structures were acquired without intravenous contrast. Angiographic
images of the Circle of Willis were acquired using MRA technique
without intravenous contrast.

[Series 5: TOF · axial · 0.5mm · 0.48mm/px · z∈[-113,-17]mm · 25 of 217 slices shown]
[im 1/217]
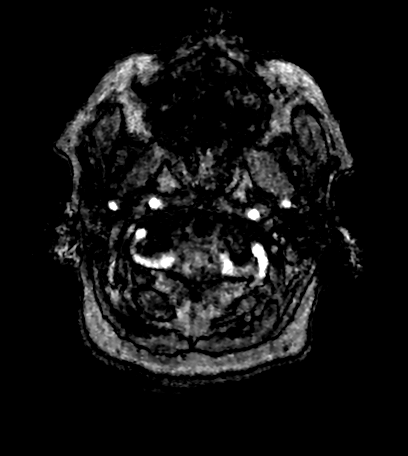
[im 5/217]
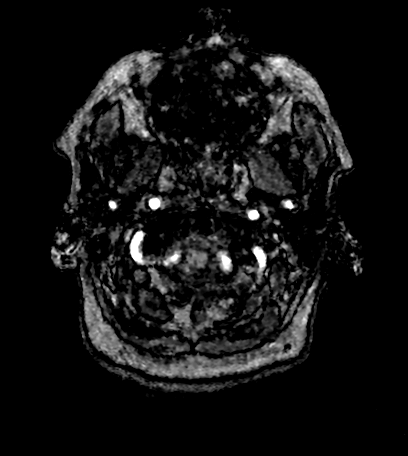
[im 10/217]
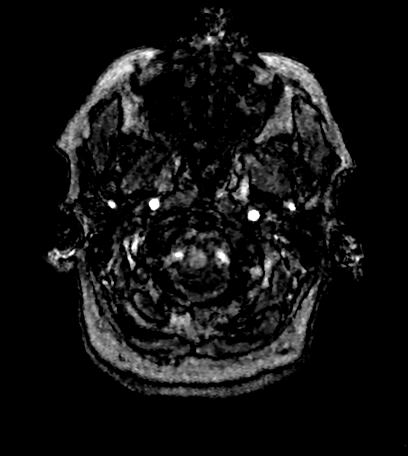
[im 14/217]
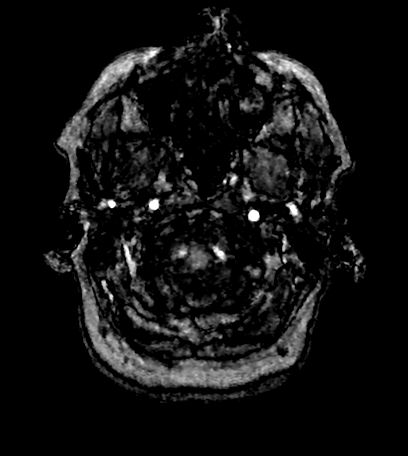
[im 19/217]
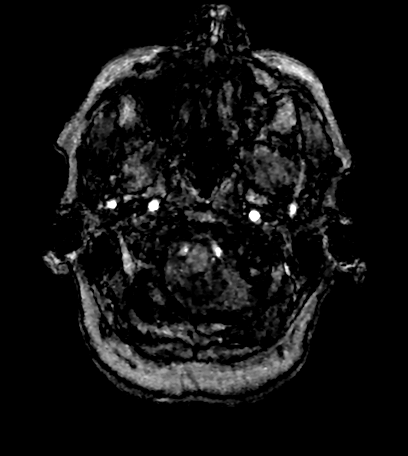
[im 23/217]
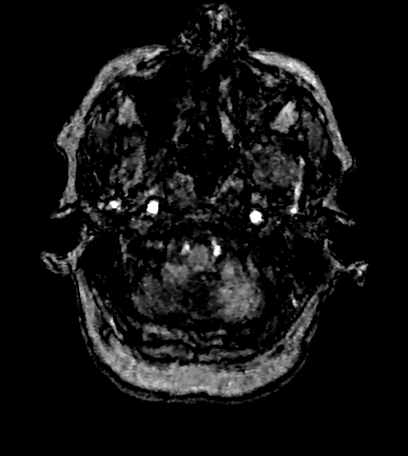
[im 28/217]
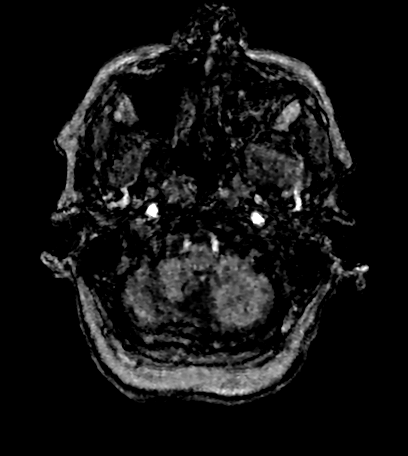
[im 33/217]
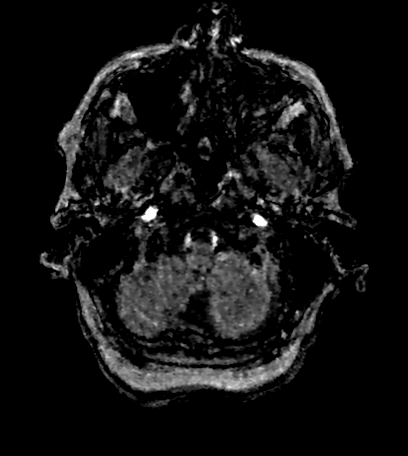
[im 37/217]
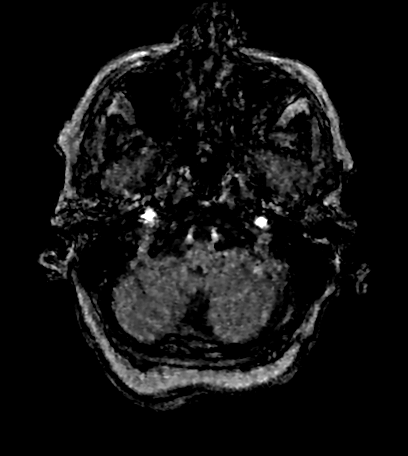
[im 42/217]
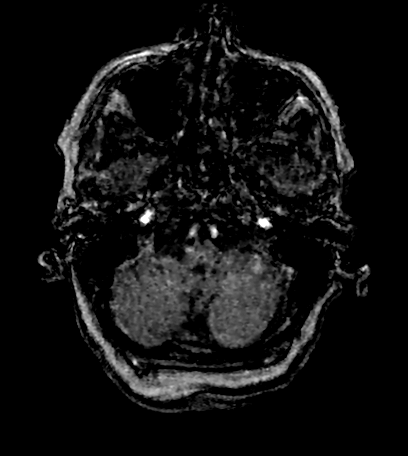
[im 46/217]
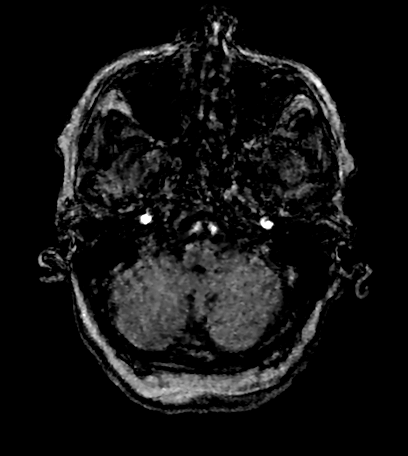
[im 51/217]
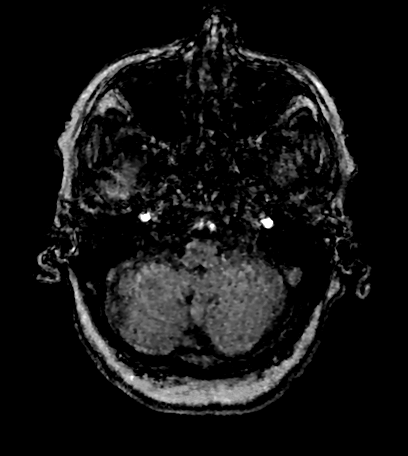
[im 56/217]
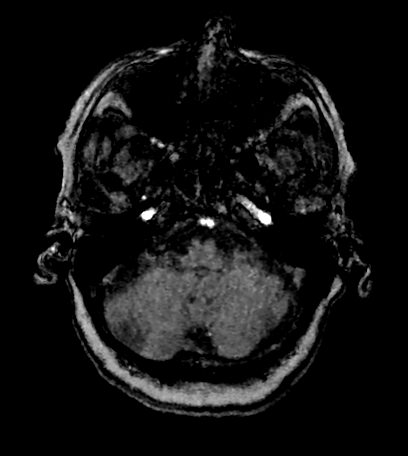
[im 60/217]
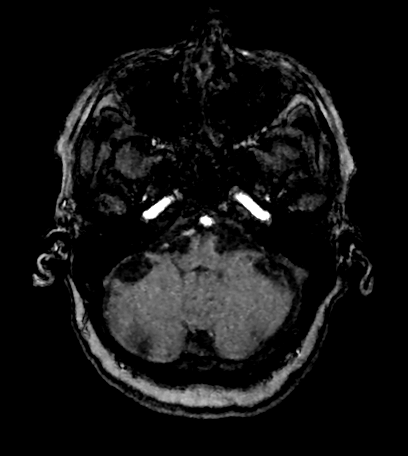
[im 65/217]
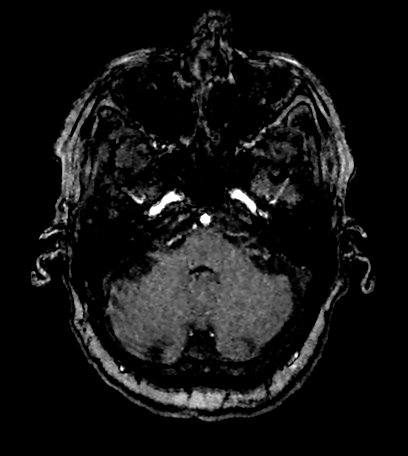
[im 69/217]
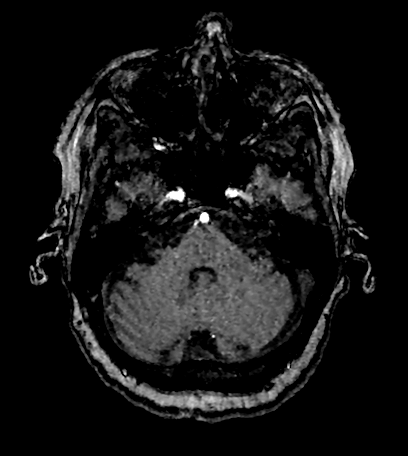
[im 74/217]
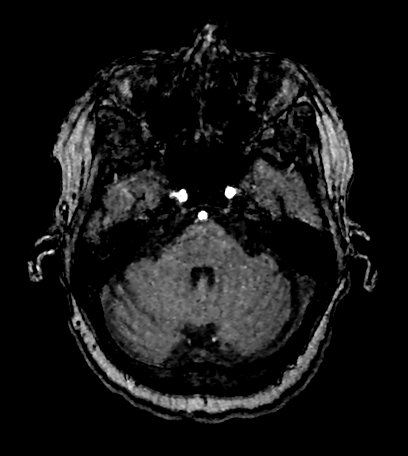
[im 79/217]
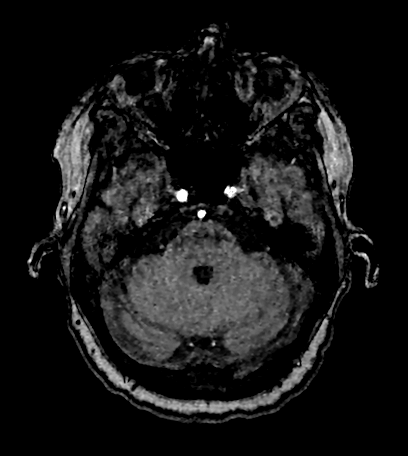
[im 97/217]
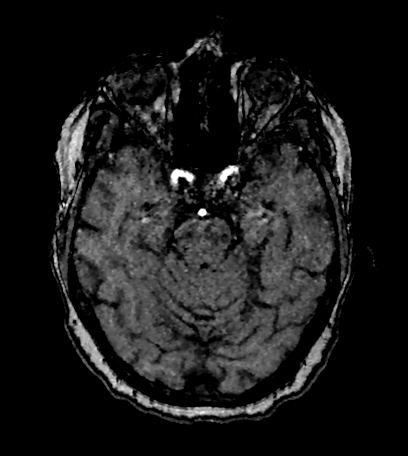
[im 111/217]
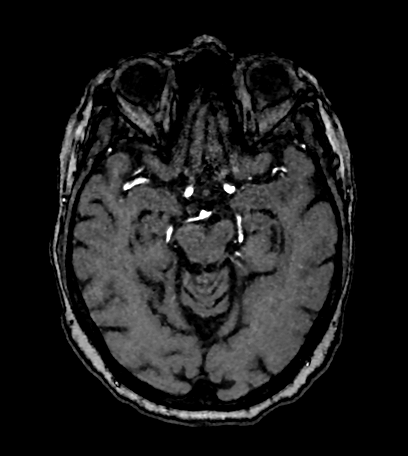
[im 125/217]
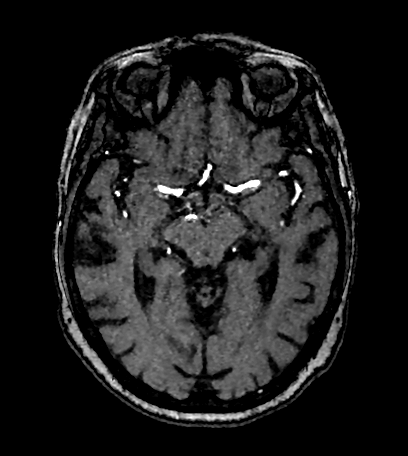
[im 152/217]
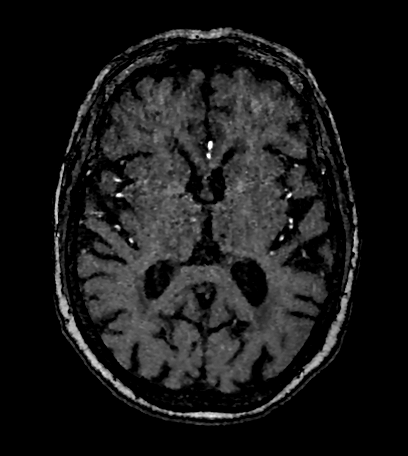
[im 180/217]
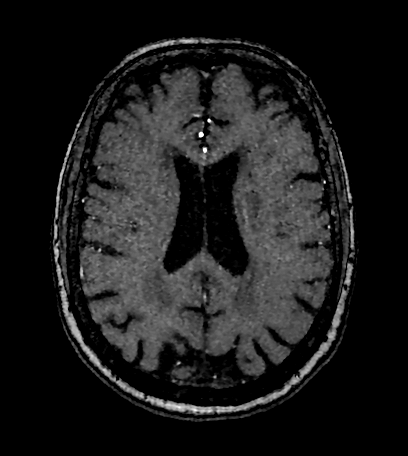
[im 184/217]
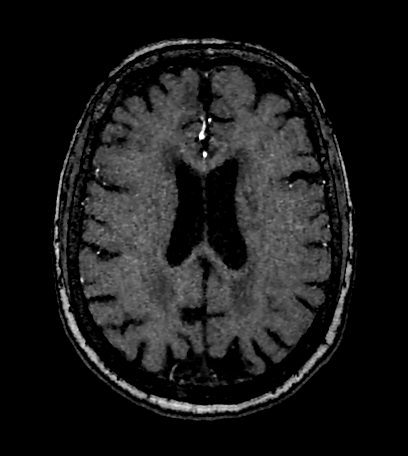
[im 207/217]
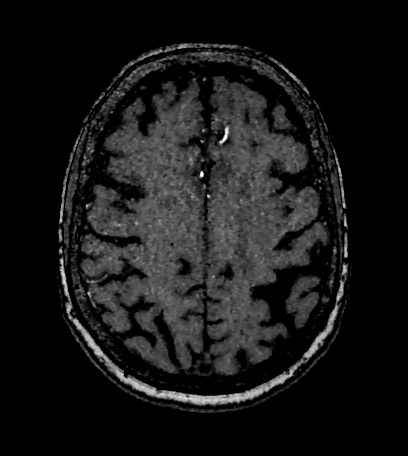

[25 of 48 positions shown; findings below may reference images not displayed]

FINDINGS: MRI HEAD FINDINGS

Brain: Age-related cerebral atrophy with moderately advanced chronic
microvascular ischemic disease. Approximate 2 cm acute ischemic
infarct seen involving the left basal ganglia. No associated
hemorrhage or mass effect. No other evidence for acute or subacute
ischemia. No other areas of chronic cortical infarction. No acute
intracranial hemorrhage. Single punctate chronic microhemorrhage
noted within the right cerebral hemisphere, of doubtful significance
in isolation.

No mass lesion, midline shift or mass effect. No hydrocephalus or
extra-axial fluid collection. Pituitary gland suprasellar region
within normal limits.

Vascular: Major intracranial vascular flow voids are maintained.

Skull and upper cervical spine: Basal junction normal. Bone marrow
signal intensity normal. No scalp soft tissue abnormality.

Sinuses/Orbits: Prior bilateral ocular lens replacement. Scattered
mucosal thickening noted about the ethmoidal air cells and maxillary
sinuses, greatest within the left maxillary sinus. No significant
mastoid effusion.

Other: None.

MRA HEAD FINDINGS

Anterior circulation: Examination degraded by motion artifact.

Both internal carotid arteries patent to the termini without
appreciable stenosis or other abnormality. A1 segments patent.
Normal anterior communicating artery complex. Right ACA patent to
its distal aspect without stenosis. Suspected moderate proximal left
A2 stenosis (series 5, image 129). Left ACA otherwise patent
distally. No M1 stenosis or occlusion. No proximal MCA branch
occlusion. Distal MCA branches perfused and symmetric.

Posterior circulation: Both vertebral arteries patent without
stenosis. Neither PICA well visualized. Basilar patent to its distal
aspect without stenosis. Superior cerebellar arteries patent
bilaterally. Both PCAs primarily supplied via the basilar. Right PCA
well perfused to its distal aspect. Focal moderate proximal left P2
stenosis (series 6925, image 8). Left PCA otherwise patent.

Anatomic variants: None significant.  No aneurysm.
IMPRESSION: MRI HEAD IMPRESSION:

1. 2 cm acute ischemic nonhemorrhagic left basal ganglia infarct.
2. Underlying age-related cerebral atrophy with moderately advanced
chronic microvascular ischemic disease.

MRA HEAD IMPRESSION:

1. Negative intracranial MRA for large vessel occlusion.
2. Moderate proximal left A2 and P2 stenoses.
3. No other hemodynamically significant or correctable stenosis
identified.

Please note that while an MRA neck was also ordered as a part of
this exam, this was unable to be performed as the patient was unable
to tolerate the full length of the study. No charges for this should
be applied to the patient.

## 2024-03-13 IMAGING — CT CT ANGIO NECK
2 of 7 series · 8 of 33 positions shown · IV contrast (APPLIED)
Comparison: Prior MRI from 09/13/2021.

CLINICAL DATA: Follow-up examination for stroke.



[Series 4: cta neck · axial · 0.50mm/px · z∈[-241,-165]mm · 2 of 114 slices shown]
[im 38/114  soft-tissue]
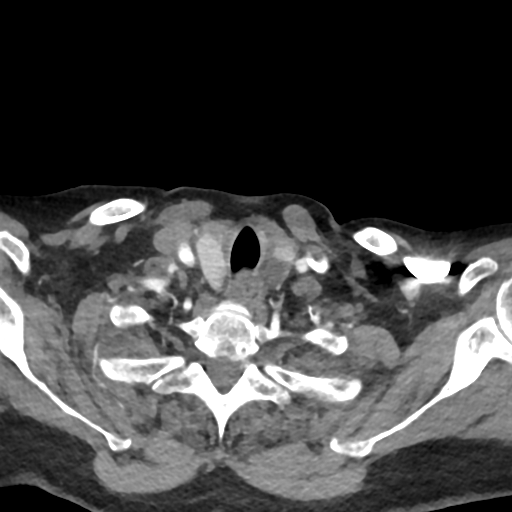
[im 76/114  soft-tissue]
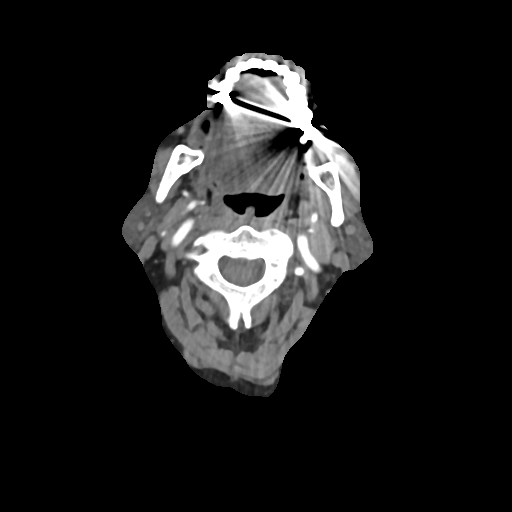

[Series 6: ax thins · axial · 0.39mm/px · z∈[-282,-120]mm · 6 of 228 slices shown]
[im 33/228  soft-tissue]
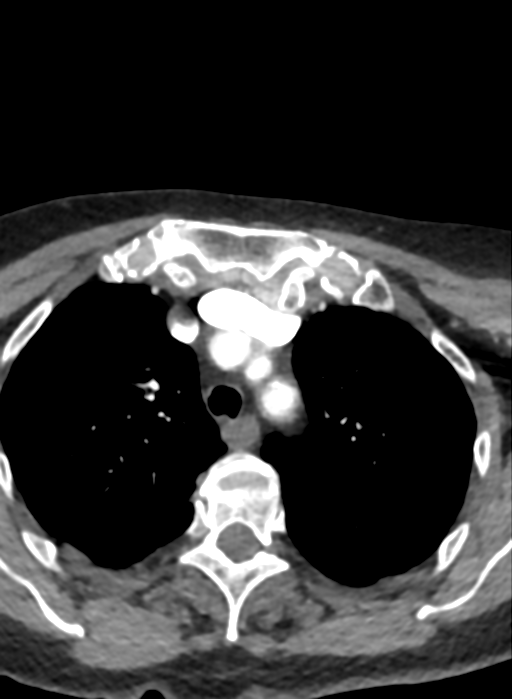
[im 65/228  bone]
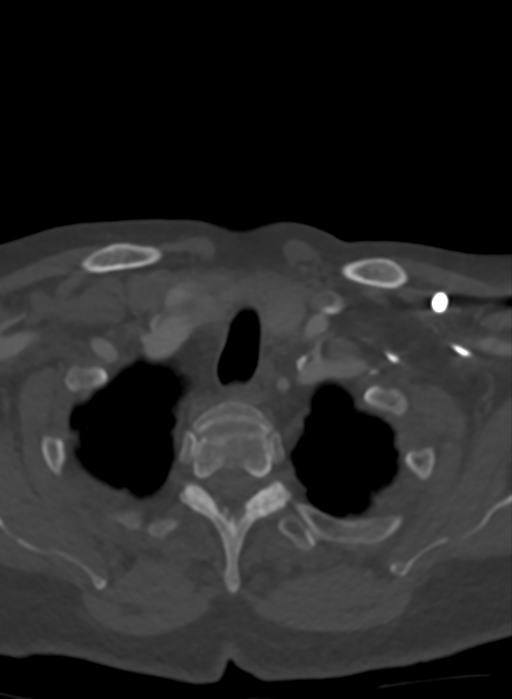
[im 98/228  soft-tissue]
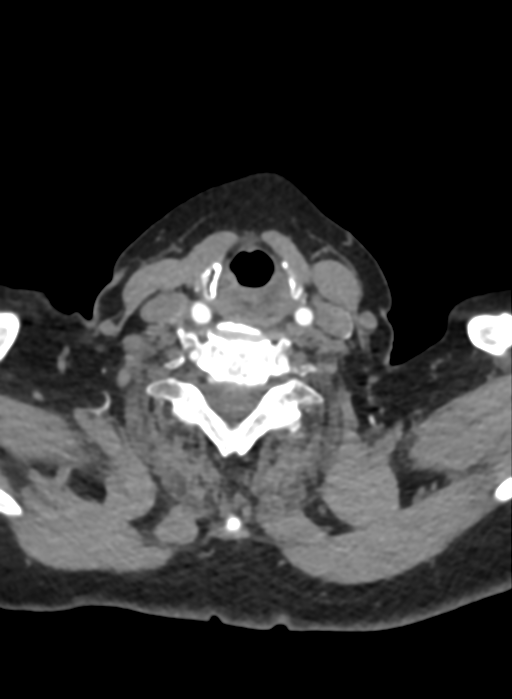
[im 130/228  bone]
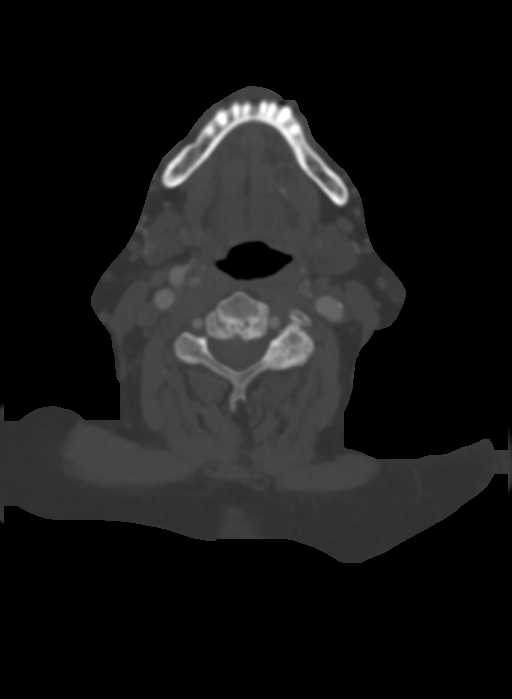
[im 163/228  soft-tissue]
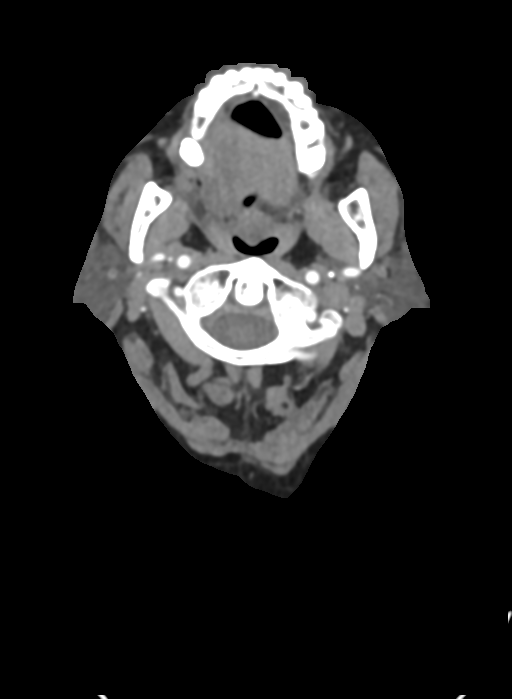
[im 195/228  bone]
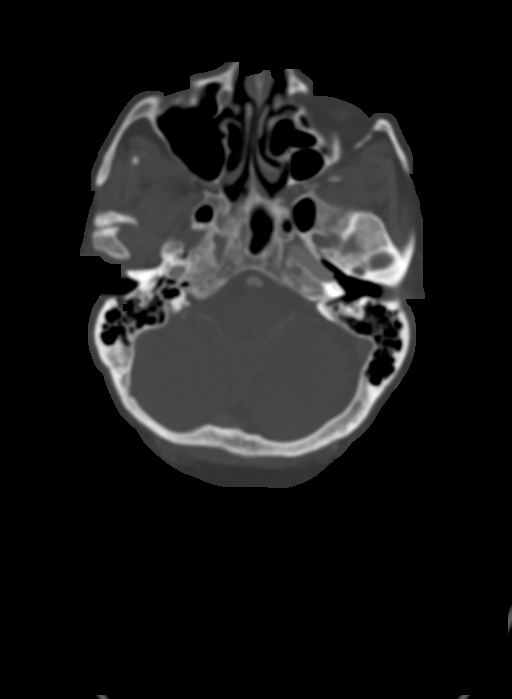

[8 of 33 positions shown; findings below may reference images not displayed]

RADIATION DOSE REDUCTION: This exam was performed according to the
departmental dose-optimization program which includes automated
exposure control, adjustment of the mA and/or kV according to
patient size and/or use of iterative reconstruction technique.

CONTRAST:  75mL OMNIPAQUE IOHEXOL 350 MG/ML SOLN
FINDINGS: Aortic arch: Visualized aortic arch normal caliber with standard 3
vessel morphology. Mild for age plaque about the arch itself. No
hemodynamically significant stenosis about the origin of the great
vessels.

Right carotid system: Right common and internal carotid arteries
patent without stenosis or dissection. Minor for age atheromatous
change about the right carotid bulb without stenosis.

Left carotid system: Left common and internal carotid arteries
patent without stenosis or dissection. Minor for age atheromatous
change about the left carotid bulb without stenosis.

Vertebral arteries: Both vertebral arteries arise from the
subclavian arteries. No significant proximal subclavian artery
stenosis. Vertebral arteries patent without stenosis or dissection.

Skeleton: No discrete or worrisome osseous lesions. Mild for age
degenerative spondylosis. Degenerative changes noted about the TMJs
bilaterally.

Other neck: No other acute soft tissue abnormality within the neck.
Incidental 1.5 cm left thyroid nodule noted. In the setting of
significant comorbidities or limited life expectancy, no follow-up
recommended (ref: [HOSPITAL]. [DATE]): 143-50).

Upper chest: Scattered ground-glass density noted within the
partially visualized lungs, likely mild pulmonary interstitial
edema.
IMPRESSION: 1. Negative CTA of the neck. No hemodynamically significant stenosis
or other acute vascular abnormality.
2. Scattered ground-glass density within the partially visualized
lungs, likely mild pulmonary interstitial edema.
